# Patient Record
Sex: Female | Born: 1995 | Race: Black or African American | Hispanic: No | Marital: Single | Smoking: Current every day smoker
Health system: Southern US, Community
[De-identification: ages and names within clinical notes are randomized; demographics above are authoritative.]

## PROBLEM LIST (undated history)

## (undated) ENCOUNTER — Inpatient Hospital Stay: Payer: Self-pay

## (undated) DIAGNOSIS — F4310 Post-traumatic stress disorder, unspecified: Secondary | ICD-10-CM

## (undated) DIAGNOSIS — F32A Depression, unspecified: Secondary | ICD-10-CM

## (undated) HISTORY — PX: NO PAST SURGERIES: SHX2092

---
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
## 2015-12-09 ENCOUNTER — Emergency Department
Admission: EM | Admit: 2015-12-09 | Discharge: 2015-12-09 | Disposition: A | Discharge status: Home / Self Care | Payer: Medicaid - Out of State | Attending: Emergency Medicine | Admitting: Emergency Medicine

## 2015-12-09 ENCOUNTER — Encounter: Payer: Self-pay | Admitting: Emergency Medicine

## 2015-12-09 DIAGNOSIS — J029 Acute pharyngitis, unspecified: Secondary | ICD-10-CM | POA: Insufficient documentation

## 2015-12-09 DIAGNOSIS — F17200 Nicotine dependence, unspecified, uncomplicated: Secondary | ICD-10-CM | POA: Insufficient documentation

## 2015-12-09 LAB — POCT RAPID STREP A: Streptococcus, Group A Screen (Direct): NEGATIVE

## 2015-12-09 MED ORDER — DEXAMETHASONE SODIUM PHOSPHATE 4 MG/ML IJ SOLN
INTRAMUSCULAR | Status: AC
  Administered 2015-12-09: 10 mg via INTRAVENOUS
  Filled 2015-12-09: qty 3

## 2015-12-09 MED ORDER — KETOROLAC TROMETHAMINE 30 MG/ML IJ SOLN
30.0000 mg | Freq: Once | INTRAMUSCULAR | Status: AC
  Administered 2015-12-09: 30 mg via INTRAVENOUS
  Filled 2015-12-09: qty 1

## 2015-12-09 MED ORDER — DEXAMETHASONE SODIUM PHOSPHATE 4 MG/ML IJ SOLN
10.0000 mg | Freq: Once | INTRAMUSCULAR | Status: AC
  Administered 2015-12-09: 10 mg via INTRAVENOUS

## 2015-12-09 MED ORDER — IBUPROFEN 600 MG PO TABS: 600.0000 mg | ORAL_TABLET | Freq: Four times a day (QID) | ORAL | 0 refills | Status: DC | PRN

## 2015-12-09 MED ORDER — DEXAMETHASONE SODIUM PHOSPHATE 10 MG/ML IJ SOLN: 10.0000 mg | Freq: Once | INTRAMUSCULAR | Status: DC

## 2015-12-09 NOTE — ED Provider Notes (Signed)
[facility] Emergency Department Provider Note    ____________________________________________   I have reviewed the triage vital signs and the nursing notes.   HISTORY  Chief Complaint Sore Throat   History limited by: Not Limited   HPI Unice CobbleJavica S Kronk is a 20 y.o. female who presents to the emergency department today because of concerns for sore throat. The patient states she has been having pain in her throat for the past 3 days. She describes it as burning. Has gradually got worse and is become severe. She stated the symptoms are worse with swallowing. She feels like she cannot even swallow her spit at this point. She states she did have fevers when this first started. She denies any nausea or vomiting. Denies any known sick contacts. Denies any difficulty with breathing.   History reviewed. No pertinent past medical history.  There are no active problems to display for this patient.   History reviewed. No pertinent surgical history.  Prior to Admission medications   Not on File    Allergies Patient has no known allergies.  History reviewed. No pertinent family history.  Social History Social History  Substance Use Topics  . Smoking status: Current Every Day Smoker  . Smokeless tobacco: Never Used  . Alcohol use No    Review of Systems  Constitutional: Positive for fever. Cardiovascular: Negative for chest pain. Respiratory: Negative for shortness of breath. Gastrointestinal: Negative for abdominal pain, vomiting and diarrhea. Genitourinary: Negative for dysuria. Musculoskeletal: Negative for back pain. Skin: Negative for rash. Neurological: Negative for headaches, focal weakness or numbness.  10-point ROS otherwise negative.  ____________________________________________   PHYSICAL EXAM:  VITAL SIGNS: ED Triage Vitals [12/09/15 1653]  Enc Vitals Group     BP 127/78     Pulse Rate 76     Resp 18     Temp 98.7 F (37.1 C)      Temp Source Oral     SpO2 100 %     Weight 180 lb (81.6 kg)     Height 5\' 9"  (1.753 m)     Head Circumference      Peak Flow      Pain Score 9   Constitutional: Alert and oriented. Well appearing and in no distress. Eyes: Conjunctivae are normal. Normal extraocular movements. ENT   Head: Normocephalic and atraumatic.   Nose: No congestion/rhinnorhea.   Mouth/Throat: Mucous membranes are moist. Bilateral pharyngeal erythema and swelling. No uvula deviation.   Neck: No stridor. No hyoid tenderness.  Hematological/Lymphatic/Immunilogical: No cervical lymphadenopathy. Cardiovascular: Normal rate, regular rhythm.  No murmurs, rubs, or gallops.  Respiratory: Normal respiratory effort without tachypnea nor retractions. Breath sounds are clear and equal bilaterally. No wheezes/rales/rhonchi. Gastrointestinal: Soft and nontender. No distention.  Genitourinary: Deferred Musculoskeletal: Normal range of motion in all extremities. No lower extremity edema. Neurologic:  Normal speech and language. No gross focal neurologic deficits are appreciated.  Skin:  Skin is warm, dry and intact. No rash noted. Psychiatric: Mood and affect are normal. Speech and behavior are normal. Patient exhibits appropriate insight and judgment.  ____________________________________________    LABS (pertinent positives/negatives)  Labs Reviewed  POCT RAPID STREP A  POCT pregnancy negative   ____________________________________________   EKG  None  ____________________________________________    RADIOLOGY  None  ____________________________________________   PROCEDURES  Procedures  ____________________________________________   INITIAL IMPRESSION / ASSESSMENT AND PLAN / ED COURSE  Pertinent labs & imaging results that were available during my care of the patient were  reviewed by me and considered in my medical decision making (see chart for details).  Patient here with sore  throat. On exam bilateral symmetrical pharyngeal erythema and swelling. No uvula deviation. No hyoid tenderness. No other symptoms concerning for deep tissue infection. Will give Toradol and steroids and reassess for likely viral pharyngitis.   Clinical Course    Patient does feel better after medications. Was able to drink fluids. Stated she felt better. Will plan on giving patient high-dose ibuprofen. Discussed return precautions. ____________________________________________   FINAL CLINICAL IMPRESSION(S) / ED DIAGNOSES  Final diagnoses:  Pharyngitis, unspecified etiology     Note: This dictation was prepared with Dragon dictation. Any transcriptional errors that result from this process are unintentional    Phineas SemenGraydon Tanyika Barros, MD 12/09/15 1926

## 2015-12-09 NOTE — Discharge Instructions (Signed)
Please seek medical attention for any high fevers, chest pain, shortness of breath, change in behavior, persistent vomiting, bloody stool or any other new or concerning symptoms.  

## 2015-12-09 NOTE — ED Notes (Signed)
Pt states sore throat x 3 days, denies fever, N&V, diarrhea. Pt states painful to talk, painful to swallow. Pt voice sounds hoarse. Pt denies being around anyone sick recently.

## 2015-12-09 NOTE — ED Notes (Signed)
Pt given urine cup to give sample.

## 2015-12-09 NOTE — ED Notes (Signed)
Both POC Preg and POC Strep test negative.

## 2015-12-09 NOTE — ED Triage Notes (Addendum)
Sore throat started 3 days ago. Denies fevers. Pt reports cannot swallow spit.denies trouble breathing except in AM. Tonsils touching uvula, left appears greater than right. Not drooling.

## 2015-12-11 ENCOUNTER — Emergency Department (HOSPITAL_COMMUNITY)
Admission: EM | Admit: 2015-12-11 | Discharge: 2015-12-12 | Disposition: A | Discharge status: Home / Self Care | Payer: Medicaid - Out of State | Attending: Emergency Medicine | Admitting: Emergency Medicine

## 2015-12-11 ENCOUNTER — Encounter (HOSPITAL_COMMUNITY): Payer: Self-pay | Admitting: Emergency Medicine

## 2015-12-11 DIAGNOSIS — Z79899 Other long term (current) drug therapy: Secondary | ICD-10-CM | POA: Diagnosis not present

## 2015-12-11 DIAGNOSIS — J36 Peritonsillar abscess: Secondary | ICD-10-CM

## 2015-12-11 DIAGNOSIS — F17200 Nicotine dependence, unspecified, uncomplicated: Secondary | ICD-10-CM | POA: Diagnosis not present

## 2015-12-11 NOTE — ED Notes (Signed)
Pt reports change in condition that she returned to the ER is due to swollen lymph nodes.

## 2015-12-11 NOTE — ED Triage Notes (Signed)
Per EMS pt c/o difficulty swallowing. Lung sounds clear in all fields, 100% RA. No fever noted during transport. Pt reports being seen at [facility] Wednesday and getting prescriptions filled today. [facility]. Pt took 1800mg  Ibprofun @2200 .

## 2015-12-12 LAB — RAPID STREP SCREEN (MED CTR MEBANE ONLY): STREPTOCOCCUS, GROUP A SCREEN (DIRECT): NEGATIVE

## 2015-12-12 MED ORDER — BENZOCAINE 20 % MT AERO
INHALATION_SPRAY | Freq: Once | OROMUCOSAL | Status: AC
  Administered 2015-12-12: 01:00:00 via OROMUCOSAL

## 2015-12-12 MED ORDER — AMOXICILLIN-POT CLAVULANATE 400-57 MG/5ML PO SUSR: 875.0000 mg | Freq: Two times a day (BID) | ORAL | 0 refills | Status: AC

## 2015-12-12 MED ORDER — AMOXICILLIN-POT CLAVULANATE 400-57 MG/5ML PO SUSR
880.0000 mg | Freq: Once | ORAL | Status: AC
  Administered 2015-12-12: 880 mg via ORAL
  Filled 2015-12-12: qty 11

## 2015-12-12 NOTE — ED Provider Notes (Signed)
[facility]-EMERGENCY DEPT Provider Note   CSN: [id number] Arrival date & time: 12/11/15  2129     History   Chief Complaint No chief complaint on file.   HPI Maureen Cox is a 20 y.o. female.  HPI 20 year old female presents to the ED with worsening left peritonsillar swelling and pain. Patient was seen at [facility] and diagnosed with pharyngitis. Rapid strep that was negative. Patient reports that since then his symptoms worsen. Lateralizing to the left side.. Patient has had difficulty swallowing. Still able to swallow liquids. Attempted taken Motrin for the pain with minimal relief. Denies fevers, chills, nausea, vomiting, shortness of breath, difficulty breathing.  History reviewed. No pertinent past medical history.  There are no active problems to display for this patient.   History reviewed. No pertinent surgical history.  OB History    No data available       Home Medications    Prior to Admission medications   Medication Sig Start Date End Date Taking? Authorizing Provider  amoxicillin-clavulanate (AUGMENTIN) 400-57 MG/5ML suspension Take 10.9 mLs (875 mg total) by mouth 2 (two) times daily. 12/12/15 12/22/15  Nira ConnPedro Eduardo Cardama, MD  ibuprofen (ADVIL,MOTRIN) 600 MG tablet Take 1 tablet (600 mg total) by mouth every 6 (six) hours as needed. 12/09/15   Phineas SemenGraydon Goodman, MD    Family History No family history on file.  Social History Social History  Substance Use Topics  . Smoking status: Current Every Day Smoker  . Smokeless tobacco: Never Used  . Alcohol use No     Allergies   Patient has no known allergies.   Review of Systems Review of Systems Ten systems are reviewed and are negative for acute change except as noted in the HPI   Physical Exam Updated Vital Signs BP 121/78 (BP Location: Right Arm)   Pulse 76   Temp 98 F (36.7 C) (Oral)   Resp 15   LMP 11/19/2015   SpO2 99%   Physical Exam  Constitutional: She is oriented to person, place,  and time. She appears well-developed and well-nourished. No distress.  HENT:  Head: Normocephalic and atraumatic.  Right Ear: External ear normal.  Left Ear: External ear normal.  Nose: Nose normal.  Mouth/Throat: Posterior oropharyngeal erythema and tonsillar abscesses (left) present. Tonsils are 3+ on the left. No tonsillar exudate.  Eyes: Conjunctivae and EOM are normal. No scleral icterus.  Neck: Normal range of motion and phonation normal.  Cardiovascular: Normal rate and regular rhythm.   Pulmonary/Chest: Effort normal. No stridor. No respiratory distress.  Abdominal: She exhibits no distension.  Musculoskeletal: Normal range of motion. She exhibits no edema.  Neurological: She is alert and oriented to person, place, and time.  Skin: She is not diaphoretic.  Psychiatric: She has a normal mood and affect. Her behavior is normal.  Vitals reviewed.    ED Treatments / Results  Labs (all labs ordered are listed, but only abnormal results are displayed) Labs Reviewed  RAPID STREP SCREEN (NOT AT [facility])  CULTURE, GROUP A STREP [facility])    EKG  EKG Interpretation None       Radiology No results found.  Procedures .Marland Kitchen.Incision and Drainage Date/Time: 12/12/2015 1:21 AM Performed by: Nira ConnARDAMA, PEDRO EDUARDO Authorized by: Nira ConnARDAMA, PEDRO EDUARDO   Consent:    Consent obtained:  Verbal   Consent given by:  Patient   Risks discussed:  Bleeding and pain   Alternatives discussed:  Alternative treatment Location:    Type:  Abscess   Location: left  peritonsilar. Anesthesia (see MAR for exact dosages):    Anesthesia method:  Topical application   Topical anesthesia: hurricaine spray. Procedure type:    Complexity:  Simple Procedure details:    Needle aspiration: yes     Needle size:  18 G   Drainage:  Bloody and purulent   Drainage amount:  Scant Post-procedure details:    Patient tolerance of procedure:  Tolerated well, no immediate complications   (including critical  care time) Emergency Focused Ultrasound Exam Limited Ultrasound of Soft Tissue   Performed and interpreted by Dr. Eudelia Bunchardama Indication: evaluation for infection or foreign body Transverse and Sagittal views of left tonsil are obtained in real time for the purposes of evaluation of skin and underlying soft tissues.  Findings: + heterogeneous fluid collection, no hyperemia/edema of surrounding tissue Interpretation: left PTA  Images archived electronically.  CPT Codes:   Other soft tissue [id number]    Medications Ordered in ED Medications  Benzocaine (HURRCAINE) 20 % mouth spray ( Mouth/Throat Given 12/12/15 0114)  amoxicillin-clavulanate (AUGMENTIN) 400-57 MG/5ML suspension 880 mg (880 mg Oral Given 12/12/15 0129)     Initial Impression / Assessment and Plan / ED Course  I have reviewed the triage vital signs and the nursing notes.  Pertinent labs & imaging results that were available during my care of the patient were reviewed by me and considered in my medical decision making (see chart for details).  Clinical Course     Left peritonsillar abscess confirmed by ultrasound. Needle aspiration was performed obtaining a small amount of bloody/purulent fluid. Improved symptomatology. Rapid strep negative. Culture sent. Provided patient with a dose of Augmentin suspension.  Able to tolerate by mouth.  Safe for discharge with strict return precautions.  Final Clinical Impressions(s) / ED Diagnoses   Final diagnoses:  Peritonsillar abscess   Disposition: Discharge  Condition: Good  I have discussed the results, Dx and Tx plan with the patient who expressed understanding and agree(s) with the plan. Discharge instructions discussed at great length. The patient was given strict return precautions who verbalized understanding of the instructions. No further questions at time of discharge.    New Prescriptions   AMOXICILLIN-CLAVULANATE (AUGMENTIN) 400-57 MG/5ML SUSPENSION    Take  10.9 mLs (875 mg total) by mouth 2 (two) times daily.    Follow Up: [facility] [address] [place] [id number] [phone] Call  For help establishing care with a care provider  [facility] [address] [id number] [place] [id number] [phone] Go to  in 3-5 days, If symptoms do not improve or  worsen      Nira ConnPedro Eduardo Cardama, MD 12/12/15 [phone]

## 2015-12-14 LAB — CULTURE, GROUP A STREP (THRC): SPECIAL REQUESTS: NORMAL

## 2015-12-14 LAB — POCT PREGNANCY, URINE: Preg Test, Ur: NEGATIVE

## 2016-11-26 ENCOUNTER — Encounter: Payer: Self-pay | Admitting: Emergency Medicine

## 2016-11-26 ENCOUNTER — Emergency Department
Admission: EM | Admit: 2016-11-26 | Discharge: 2016-11-26 | Disposition: A | Discharge status: Home / Self Care | Payer: Medicaid - Out of State | Attending: Emergency Medicine | Admitting: Emergency Medicine

## 2016-11-26 ENCOUNTER — Other Ambulatory Visit: Payer: Self-pay

## 2016-11-26 DIAGNOSIS — J029 Acute pharyngitis, unspecified: Secondary | ICD-10-CM | POA: Insufficient documentation

## 2016-11-26 DIAGNOSIS — F17210 Nicotine dependence, cigarettes, uncomplicated: Secondary | ICD-10-CM | POA: Diagnosis not present

## 2016-11-26 MED ORDER — IBUPROFEN 600 MG PO TABS: 600.0000 mg | ORAL_TABLET | Freq: Four times a day (QID) | ORAL | 0 refills | Status: DC | PRN

## 2016-11-26 MED ORDER — AMOXICILLIN 500 MG PO TABS: 500.0000 mg | ORAL_TABLET | Freq: Three times a day (TID) | ORAL | 0 refills | Status: DC

## 2016-11-26 NOTE — ED Notes (Signed)
Pt to ed with c/o sore throat x 4 days,  Pt states fever x 2 days, none today.  Has not take OTC meds for fever in last 24 hours.  Pt throat red and swollen with muffled speech.  Pt also reports earache bilaterally x 2 days.

## 2016-11-26 NOTE — Discharge Instructions (Signed)
Follow up with the primary care provider of your choice for symptoms that are not improving over the next 48 hours.  Return to the ER for symptoms that change or worsen if unable to schedule an appointment.

## 2016-11-26 NOTE — ED Triage Notes (Signed)
Sore throat x 4 days 

## 2016-11-26 NOTE — ED Provider Notes (Signed)
[facility] Emergency Department Provider Note  ____________________________________________  Time seen: Approximately 8:20 AM  I have reviewed the triage vital signs and the nursing notes.   HISTORY  Chief Complaint Sore Throat    HPI Maureen Cox is a 21 y.o. female who presents to the emergency department for evaluation and treatment of sore throat for the past 4 days. She also states that she's had a fever for 2 days. She has not taken any medications for her sore throat or fever. She also states that she's developed some pain in both ears over the past couple of days. She reports no chronic medical history, no known drug allergies, and no current daily medications.  History reviewed. No pertinent past medical history.  There are no active problems to display for this patient.   History reviewed. No pertinent surgical history.  Prior to Admission medications   Medication Sig Start Date End Date Taking? Authorizing Provider  amoxicillin (AMOXIL) 500 MG tablet Take 1 tablet (500 mg total) 3 (three) times daily by mouth. 11/26/16   Aleshia Cartelli B, FNP  ibuprofen (ADVIL,MOTRIN) 600 MG tablet Take 1 tablet (600 mg total) every 6 (six) hours as needed by mouth. 11/26/16   Amirrah Quigley B, FNP    Allergies Patient has no known allergies.  No family history on file.  Social History Social History   Tobacco Use  . Smoking status: Current Every Day Smoker    Packs/day: 0.50    Types: Cigarettes  . Smokeless tobacco: Never Used  Substance Use Topics  . Alcohol use: No  . Drug use: No    Review of Systems Constitutional: Positive for fever. Eyes: No visual changes. ENT: Positive for sore throat; negative for difficulty swallowing. Respiratory: Denies shortness of breath. Gastrointestinal: No abdominal pain.  No nausea, no vomiting.  No diarrhea.  Genitourinary: Negative for dysuria. Musculoskeletal: Negative for generalized body  aches. Skin: Negative for rash. Neurological: Negative for headaches, negative  focal weakness or numbness.  ____________________________________________   PHYSICAL EXAM:  VITAL SIGNS: ED Triage Vitals  Enc Vitals Group     BP 11/26/16 0729 122/87     Pulse Rate 11/26/16 0729 96     Resp 11/26/16 0729 20     Temp 11/26/16 0729 98.6 F (37 C)     Temp Source 11/26/16 0729 Oral     SpO2 11/26/16 0729 96 %     Weight 11/26/16 0730 182 lb (82.6 kg)     Height 11/26/16 0730 5\' 9"  (1.753 m)     Head Circumference --      Peak Flow --      Pain Score 11/26/16 0729 10     Pain Loc --      Pain Edu? --      Excl. in GC? --    Constitutional: Alert and oriented. Well appearing and in no acute distress. Eyes: Conjunctivae are normal.  Head: Atraumatic. Nose: No congestion/rhinnorhea. Ears: Bilateral tympanic membranes are normal Mouth/Throat: Mucous membranes are moist.  Oropharynx erythematous, tonsils 2+ with exudate. Uvula is midline. Neck: No stridor.  Lymphatic: Lymphadenopathy: Bilateral anterior cervical lymphadenopathy palpable on exam. Cardiovascular: Normal rate, regular rhythm. Good peripheral circulation. Respiratory: Normal respiratory effort. Lungs CTAB. Gastrointestinal: Soft and nontender. Musculoskeletal: No lower extremity tenderness nor edema.  Neurologic:  Normal speech and language. No gross focal neurologic deficits are appreciated. Speech is normal. No gait instability. Skin:  Skin is warm, dry and intact. No rash noted Psychiatric:  Mood and affect are normal. Speech and behavior are normal.  ____________________________________________   LABS (all labs ordered are listed, but only abnormal results are displayed)  Labs Reviewed - No data to display ____________________________________________  EKG  Not indicated ____________________________________________  RADIOLOGY  Not  indicated ____________________________________________   PROCEDURES  Procedure(s) performed: None  Critical Care performed: No ____________________________________________   INITIAL IMPRESSION / ASSESSMENT AND PLAN / ED COURSE  21 year old female presenting to the emergency department for treatment and evaluation of sore throat, fever, and ear pain for the past 4 days. Exudate is noted on tonsils, therefore she will be treated with amoxicillin and will be given ibuprofen for pain and fever. She was instructed to follow-up with the primary care provider for choice for symptoms that are not improving over the next couple of days. She was instructed to return to the emergency department for symptoms that change or worsen if she is unable schedule an appointment.  Pertinent labs & imaging results that were available during my care of the patient were reviewed by me and considered in my medical decision making (see chart for details). ____________________________________________   FINAL CLINICAL IMPRESSION(S) / ED DIAGNOSES  Final diagnoses:  Exudative pharyngitis    If controlled substance prescribed during this visit, 12 month history viewed on the [facility] prior to issuing an initial prescription for Schedule II or III opiod.   Note:  This document was prepared using Dragon voice recognition software and may include unintentional dictation errors.    Chinita Pesterriplett, Jomayra Novitsky B, FNP 11/26/16 1100    Arnaldo NatalMalinda, Paul F, MD 11/26/16 [phone]

## 2017-04-11 ENCOUNTER — Emergency Department
Admission: EM | Admit: 2017-04-11 | Discharge: 2017-04-11 | Disposition: A | Discharge status: Home / Self Care | Payer: Medicaid - Out of State | Attending: Emergency Medicine | Admitting: Emergency Medicine

## 2017-04-11 ENCOUNTER — Encounter: Payer: Self-pay | Admitting: Emergency Medicine

## 2017-04-11 ENCOUNTER — Other Ambulatory Visit: Payer: Self-pay

## 2017-04-11 DIAGNOSIS — F17210 Nicotine dependence, cigarettes, uncomplicated: Secondary | ICD-10-CM | POA: Insufficient documentation

## 2017-04-11 DIAGNOSIS — N3001 Acute cystitis with hematuria: Secondary | ICD-10-CM

## 2017-04-11 DIAGNOSIS — B9689 Other specified bacterial agents as the cause of diseases classified elsewhere: Secondary | ICD-10-CM

## 2017-04-11 DIAGNOSIS — N760 Acute vaginitis: Secondary | ICD-10-CM | POA: Insufficient documentation

## 2017-04-11 LAB — URINALYSIS, COMPLETE (UACMP) WITH MICROSCOPIC
Bilirubin Urine: NEGATIVE
GLUCOSE, UA: NEGATIVE mg/dL
KETONES UR: NEGATIVE mg/dL
NITRITE: NEGATIVE
PROTEIN: NEGATIVE mg/dL
Specific Gravity, Urine: 1.015 (ref 1.005–1.030)
pH: 6 (ref 5.0–8.0)

## 2017-04-11 LAB — WET PREP, GENITAL
Sperm: NONE SEEN
Trich, Wet Prep: NONE SEEN
YEAST WET PREP: NONE SEEN

## 2017-04-11 LAB — CHLAMYDIA/NGC RT PCR (ARMC ONLY)
CHLAMYDIA TR: NOT DETECTED
N GONORRHOEAE: NOT DETECTED

## 2017-04-11 LAB — POC URINE PREG, ED: Preg Test, Ur: NEGATIVE

## 2017-04-11 MED ORDER — METRONIDAZOLE 500 MG PO TABS: 500.0000 mg | ORAL_TABLET | Freq: Two times a day (BID) | ORAL | 0 refills | Status: AC

## 2017-04-11 MED ORDER — CEPHALEXIN 500 MG PO CAPS: 500.0000 mg | ORAL_CAPSULE | Freq: Two times a day (BID) | ORAL | 0 refills | Status: AC

## 2017-04-11 NOTE — ED Triage Notes (Addendum)
Patient ambulatory to triage with steady gait, without difficulty or distress noted; pt reports here for vag itching & irritation x 2 days; denies vag discharge

## 2017-04-11 NOTE — ED Provider Notes (Signed)
[facility] Emergency Department Provider Note  ____________________________________________  Time seen: Approximately 8:02 PM  I have reviewed the triage vital signs and the nursing notes.   HISTORY  Chief Complaint Vaginal Itching    HPI Maureen Cox is a 22 y.o. female that presents to the emergency department for evaluation of vaginal itching and dysuria for 2 days.  Low back was hurting yesterday.  Patient denies nausea, vomiting, abdominal pain, vaginal discharge, hematuria.  History reviewed. No pertinent past medical history.  There are no active problems to display for this patient.   History reviewed. No pertinent surgical history.  Prior to Admission medications   Medication Sig Start Date End Date Taking? Authorizing Provider  amoxicillin (AMOXIL) 500 MG tablet Take 1 tablet (500 mg total) 3 (three) times daily by mouth. 11/26/16   Triplett, Cari B, FNP  cephALEXin (KEFLEX) 500 MG capsule Take 1 capsule (500 mg total) by mouth 2 (two) times daily for 10 days. 04/11/17 04/21/17  Enid DerryWagner, Salene Mohamud, PA-C  ibuprofen (ADVIL,MOTRIN) 600 MG tablet Take 1 tablet (600 mg total) every 6 (six) hours as needed by mouth. 11/26/16   Triplett, Cari B, FNP  metroNIDAZOLE (FLAGYL) 500 MG tablet Take 1 tablet (500 mg total) by mouth 2 (two) times daily for 7 days. 04/11/17 04/18/17  Enid DerryWagner, Kimiyah Blick, PA-C    Allergies Patient has no known allergies.  No family history on file.  Social History Social History   Tobacco Use  . Smoking status: Current Every Day Smoker    Packs/day: 0.50    Types: Cigarettes  . Smokeless tobacco: Never Used  Substance Use Topics  . Alcohol use: No  . Drug use: No     Review of Systems  Constitutional: No fever/chills Cardiovascular: No chest pain. Respiratory: No SOB. Gastrointestinal: No abdominal pain.  No nausea, no vomiting.  Genitourinary: Positive for dysuria. Musculoskeletal: Positive for low back pain. Skin:  Negative for rash, abrasions, lacerations, ecchymosis.   ____________________________________________   PHYSICAL EXAM:  VITAL SIGNS: ED Triage Vitals  Enc Vitals Group     BP 04/11/17 1903 (!) 129/54     Pulse Rate 04/11/17 1903 78     Resp 04/11/17 1903 20     Temp 04/11/17 1903 98.4 F (36.9 C)     Temp Source 04/11/17 1903 Oral     SpO2 04/11/17 1903 98 %     Weight 04/11/17 1902 225 lb (102.1 kg)     Height 04/11/17 1902 5\' 9"  (1.753 m)     Head Circumference --      Peak Flow --      Pain Score 04/11/17 1902 0     Pain Loc --      Pain Edu? --      Excl. in GC? --      Constitutional: Alert and oriented. Well appearing and in no acute distress. Eyes: Conjunctivae are normal. PERRL. EOMI. Head: Atraumatic. ENT:      Ears:      Nose: No congestion/rhinnorhea.      Mouth/Throat: Mucous membranes are moist.  Neck: No stridor. Cardiovascular: Normal rate, regular rhythm.  Good peripheral circulation. Respiratory: Normal respiratory effort without tachypnea or retractions. Lungs CTAB. Good air entry to the bases with no decreased or absent breath sounds. Gastrointestinal: Bowel sounds 4 quadrants. Soft and nontender to palpation. No guarding or rigidity. No palpable masses. No distention. No CVA tenderness.  Musculoskeletal: Full range of motion to all extremities. No gross deformities appreciated.  Genitourinary: RN present for pelvic exam.  No external rashes or lesions seen.  White vaginal discharge.  No cervical motion tenderness. Neurologic:  Normal speech and language. No gross focal neurologic deficits are appreciated.  Skin:  Skin is warm, dry and intact. No rash noted.   ____________________________________________   LABS (all labs ordered are listed, but only abnormal results are displayed)  Labs Reviewed  WET PREP, GENITAL - Abnormal; Notable for the following components:      Result Value   Clue Cells Wet Prep HPF POC PRESENT (*)    WBC, Wet Prep HPF  POC FEW (*)    All other components within normal limits  URINALYSIS, COMPLETE (UACMP) WITH MICROSCOPIC - Abnormal; Notable for the following components:   Color, Urine YELLOW (*)    APPearance CLOUDY (*)    Hgb urine dipstick MODERATE (*)    Leukocytes, UA LARGE (*)    Bacteria, UA RARE (*)    Squamous Epithelial / LPF 6-30 (*)    All other components within normal limits  CHLAMYDIA/NGC RT PCR ([facility] ONLY)  POC URINE PREG, ED   ____________________________________________  EKG   ____________________________________________  RADIOLOGY   No results found.  ____________________________________________    PROCEDURES  Procedure(s) performed:    Procedures    Medications - No data to display   ____________________________________________   INITIAL IMPRESSION / ASSESSMENT AND PLAN / ED COURSE  Pertinent labs & imaging results that were available during my care of the patient were reviewed by me and considered in my medical decision making (see chart for details).  Review of the [place] CSRS was performed in accordance of the [facility] prior to dispensing any controlled drugs.   Patient's diagnosis is consistent with UTI and bacterial vaginosis.  Vital signs and exam are reassuring.  Wet prep consistent with bacterial vaginosis.  Urinalysis consistent with infection.  Patient will be discharged home with prescriptions for Keflex and Flagyl. Patient is to follow up with PCP as directed. Patient is given ED precautions to return to the ED for any worsening or new symptoms.     ____________________________________________  FINAL CLINICAL IMPRESSION(S) / ED DIAGNOSES  Final diagnoses:  Acute cystitis with hematuria  Bacterial vaginosis      NEW MEDICATIONS STARTED DURING THIS VISIT:  ED Discharge Orders        Ordered    metroNIDAZOLE (FLAGYL) 500 MG tablet  2 times daily     04/11/17 2118    cephALEXin (KEFLEX) 500 MG capsule  2 times daily     04/11/17  2118          This chart was dictated using voice recognition software/Dragon. Despite best efforts to proofread, errors can occur which can change the meaning. Any change was purely unintentional.    Enid Derry, PA-C 04/11/17 2218    Loleta Rose, MD 04/11/17 2224

## 2017-11-07 ENCOUNTER — Other Ambulatory Visit: Payer: Self-pay

## 2017-11-07 ENCOUNTER — Emergency Department
Admission: EM | Admit: 2017-11-07 | Discharge: 2017-11-07 | Disposition: A | Discharge status: Home / Self Care | Payer: Self-pay | Attending: Emergency Medicine | Admitting: Emergency Medicine

## 2017-11-07 ENCOUNTER — Emergency Department: Payer: Self-pay

## 2017-11-07 ENCOUNTER — Encounter: Payer: Self-pay | Admitting: Emergency Medicine

## 2017-11-07 DIAGNOSIS — R1030 Lower abdominal pain, unspecified: Secondary | ICD-10-CM | POA: Insufficient documentation

## 2017-11-07 DIAGNOSIS — K5900 Constipation, unspecified: Secondary | ICD-10-CM | POA: Insufficient documentation

## 2017-11-07 DIAGNOSIS — R102 Pelvic and perineal pain: Secondary | ICD-10-CM

## 2017-11-07 DIAGNOSIS — F17210 Nicotine dependence, cigarettes, uncomplicated: Secondary | ICD-10-CM | POA: Insufficient documentation

## 2017-11-07 DIAGNOSIS — R112 Nausea with vomiting, unspecified: Secondary | ICD-10-CM | POA: Insufficient documentation

## 2017-11-07 LAB — URINALYSIS, COMPLETE (UACMP) WITH MICROSCOPIC
Bacteria, UA: NONE SEEN
Bilirubin Urine: NEGATIVE
Glucose, UA: NEGATIVE mg/dL
KETONES UR: 5 mg/dL — AB
Leukocytes, UA: NEGATIVE
Nitrite: NEGATIVE
PH: 7 (ref 5.0–8.0)
Protein, ur: NEGATIVE mg/dL
Specific Gravity, Urine: 1.011 (ref 1.005–1.030)

## 2017-11-07 LAB — COMPREHENSIVE METABOLIC PANEL
ALT: 30 U/L (ref 0–44)
ANION GAP: 9 (ref 5–15)
AST: 21 U/L (ref 15–41)
Albumin: 4.1 g/dL (ref 3.5–5.0)
Alkaline Phosphatase: 74 U/L (ref 38–126)
BILIRUBIN TOTAL: 0.4 mg/dL (ref 0.3–1.2)
BUN: 9 mg/dL (ref 6–20)
CALCIUM: 9.4 mg/dL (ref 8.9–10.3)
CO2: 27 mmol/L (ref 22–32)
Chloride: 104 mmol/L (ref 98–111)
Creatinine, Ser: 0.88 mg/dL (ref 0.44–1.00)
Glucose, Bld: 115 mg/dL — ABNORMAL HIGH (ref 70–99)
POTASSIUM: 3.8 mmol/L (ref 3.5–5.1)
Sodium: 140 mmol/L (ref 135–145)
TOTAL PROTEIN: 8 g/dL (ref 6.5–8.1)

## 2017-11-07 LAB — LIPASE, BLOOD: Lipase: 35 U/L (ref 11–51)

## 2017-11-07 LAB — CBC
HCT: 43.7 % (ref 36.0–46.0)
Hemoglobin: 14.6 g/dL (ref 12.0–15.0)
MCH: 28.7 pg (ref 26.0–34.0)
MCHC: 33.4 g/dL (ref 30.0–36.0)
MCV: 86 fL (ref 80.0–100.0)
PLATELETS: 452 10*3/uL — AB (ref 150–400)
RBC: 5.08 MIL/uL (ref 3.87–5.11)
RDW: 12.5 % (ref 11.5–15.5)
WBC: 6.1 10*3/uL (ref 4.0–10.5)
nRBC: 0 % (ref 0.0–0.2)

## 2017-11-07 LAB — PREGNANCY, URINE: Preg Test, Ur: NEGATIVE

## 2017-11-07 LAB — POCT PREGNANCY, URINE: Preg Test, Ur: NEGATIVE

## 2017-11-07 MED ORDER — ONDANSETRON 4 MG PO TBDP
4.0000 mg | ORAL_TABLET | Freq: Once | ORAL | Status: AC
  Administered 2017-11-07: 4 mg via ORAL
  Filled 2017-11-07: qty 1

## 2017-11-07 MED ORDER — ONDANSETRON 4 MG PO TBDP: 4.0000 mg | ORAL_TABLET | Freq: Three times a day (TID) | ORAL | 0 refills | Status: DC | PRN

## 2017-11-07 MED ORDER — IBUPROFEN 400 MG PO TABS: 600.0000 mg | ORAL_TABLET | Freq: Once | ORAL | Status: DC

## 2017-11-07 MED ORDER — IBUPROFEN 400 MG PO TABS
400.0000 mg | ORAL_TABLET | Freq: Once | ORAL | Status: AC
  Administered 2017-11-07: 400 mg via ORAL
  Filled 2017-11-07: qty 1

## 2017-11-07 NOTE — ED Provider Notes (Signed)
[facility] Emergency Department Provider Note  ____________________________________________  Time seen: Approximately 2:53 PM  I have reviewed the triage vital signs and the nursing notes.   HISTORY  Chief Complaint Emesis   HPI Maureen Cox is a 22 y.o. female no significant past medical history who presents for evaluation of nausea, vomiting, and abdominal pain.  Patient reports 2 days of lower abdominal pain that she describes as intermittent, sharp, located suprapubically, nonradiating.  No pain at this time.  She has had persistent nausea and 4 episodes of nonbloody nonbilious emesis over the last 2 days.  She is still able to eat and drink.  She denies fever, chills, dysuria, hematuria, diarrhea, chest pain, shortness of breath, vaginal discharge.  She is currently on her menstrual period.  She denies any prior STDs.  She is sexually active. No prior abdominal surgery.  Patient thinks that her last bowel movement was a week ago.  She reports that that is normal for her to be constipated.   PMH None - reviewed   Prior to Admission medications   Medication Sig Start Date End Date Taking? Authorizing Provider  amoxicillin (AMOXIL) 500 MG tablet Take 1 tablet (500 mg total) 3 (three) times daily by mouth. 11/26/16   Triplett, Cari B, FNP  ibuprofen (ADVIL,MOTRIN) 600 MG tablet Take 1 tablet (600 mg total) every 6 (six) hours as needed by mouth. 11/26/16   Triplett, Cari B, FNP  ondansetron (ZOFRAN ODT) 4 MG disintegrating tablet Take 1 tablet (4 mg total) by mouth every 8 (eight) hours as needed for nausea or vomiting. 11/07/17   Nita Sickle, MD    Allergies Patient has no known allergies.  No family history on file.  Social History Social History   Tobacco Use  . Smoking status: Current Every Day Smoker    Packs/day: 0.50    Types: Cigarettes  . Smokeless tobacco: Never Used  Substance Use Topics  . Alcohol use: No  . Drug use: No     Review of Systems  Constitutional: Negative for fever. Eyes: Negative for visual changes. ENT: Negative for sore throat. Neck: No neck pain  Cardiovascular: Negative for chest pain. Respiratory: Negative for shortness of breath. Gastrointestinal: + suprapubic abdominal pain, nausea, and vomiting. No diarrhea. Genitourinary: Negative for dysuria. Musculoskeletal: Negative for back pain. Skin: Negative for rash. Neurological: Negative for headaches, weakness or numbness. Psych: No SI or HI  ____________________________________________   PHYSICAL EXAM:  VITAL SIGNS: ED Triage Vitals  Enc Vitals Group     BP 11/07/17 1240 (!) 117/94     Pulse Rate 11/07/17 1240 83     Resp 11/07/17 1240 16     Temp 11/07/17 1240 98.7 F (37.1 C)     Temp Source 11/07/17 1240 Oral     SpO2 11/07/17 1240 99 %     Weight 11/07/17 1241 225 lb (102.1 kg)     Height 11/07/17 1241 5\' 10"  (1.778 m)     Head Circumference --      Peak Flow --      Pain Score 11/07/17 1240 8     Pain Loc --      Pain Edu? --      Excl. in GC? --     Constitutional: Alert and oriented. Well appearing and in no apparent distress. HEENT:      Head: Normocephalic and atraumatic.         Eyes: Conjunctivae are normal. Sclera is non-icteric.  Mouth/Throat: Mucous membranes are moist.       Neck: Supple with no signs of meningismus. Cardiovascular: Regular rate and rhythm. No murmurs, gallops, or rubs. 2+ symmetrical distal pulses are present in all extremities. No JVD. Respiratory: Normal respiratory effort. Lungs are clear to auscultation bilaterally. No wheezes, crackles, or rhonchi.  Gastrointestinal: Soft, non tender, and non distended with positive bowel sounds. No rebound or guarding. Genitourinary: No CVA tenderness. Musculoskeletal: Nontender with normal range of motion in all extremities. No edema, cyanosis, or erythema of extremities. Neurologic: Normal speech and language. Face is symmetric.  Moving all extremities. No gross focal neurologic deficits are appreciated. Skin: Skin is warm, dry and intact. No rash noted. Psychiatric: Mood and affect are normal. Speech and behavior are normal.  ____________________________________________   LABS (all labs ordered are listed, but only abnormal results are displayed)  Labs Reviewed  COMPREHENSIVE METABOLIC PANEL - Abnormal; Notable for the following components:      Result Value   Glucose, Bld 115 (*)    All other components within normal limits  CBC - Abnormal; Notable for the following components:   Platelets 452 (*)    All other components within normal limits  URINALYSIS, COMPLETE (UACMP) WITH MICROSCOPIC - Abnormal; Notable for the following components:   Color, Urine YELLOW (*)    APPearance CLEAR (*)    Hgb urine dipstick LARGE (*)    Ketones, ur 5 (*)    All other components within normal limits  LIPASE, BLOOD  PREGNANCY, URINE  POC URINE PREG, ED  POCT PREGNANCY, URINE   ____________________________________________  EKG  none  ____________________________________________  RADIOLOGY  I have personally reviewed the images performed during this visit and I agree with the Radiologist's read.   Interpretation by Radiologist:  Dg Abdomen 1 View  Result Date: 11/07/2017 CLINICAL DATA:  22 y/o  F; 2 days of vomiting. EXAM: ABDOMEN - 1 VIEW COMPARISON:  None. FINDINGS: The bowel gas pattern is normal. No radio-opaque calculi or other significant radiographic abnormality are seen. IMPRESSION: Negative. Electronically Signed   By: Mitzi Hansen M.D.   On: 11/07/2017 14:47     ____________________________________________   PROCEDURES  Procedure(s) performed: None Procedures Critical Care performed:  None ____________________________________________   INITIAL IMPRESSION / ASSESSMENT AND PLAN / ED COURSE  22 y.o. female no significant past medical history who presents for evaluation of nausea,  vomiting, and abdominal pain.  Patient is extremely well-appearing, no distress, looks well-hydrated, normal vital signs, abdomen is soft with no tenderness throughout.  Pregnancy test negative.  Labs including CBC, CMP and lipase with no acute findings.  UA negative for UTI.  Recommended STD testing but patient refused.  At this time there is no tenderness or abdominal pain therefore low clinical suspicion for any acute intra-abdominal pathology such as appendicitis, ovarian pathology, diverticulitis, or SBO.  Patient be discharged home with Zofran and MiraLAX for constipation.  Recommend close follow-up with primary care doctor.  Discussed my standard return precautions.      As part of my medical decision making, I reviewed the following data within the electronic MEDICAL RECORD NUMBER Nursing notes reviewed and incorporated, Labs reviewed , Old chart reviewed, Radiograph reviewed , Notes from prior ED visits and [place] Controlled Substance Database    Pertinent labs & imaging results that were available during my care of the patient were reviewed by me and considered in my medical decision making (see chart for details).    ____________________________________________   FINAL  CLINICAL IMPRESSION(S) / ED DIAGNOSES  Final diagnoses:  Non-intractable vomiting with nausea, unspecified vomiting type  Constipation, unspecified constipation type  Suprapubic abdominal pain      NEW MEDICATIONS STARTED DURING THIS VISIT:  ED Discharge Orders         Ordered    ondansetron (ZOFRAN ODT) 4 MG disintegrating tablet  Every 8 hours PRN     11/07/17 1500           Note:  This document was prepared using Dragon voice recognition software and may include unintentional dictation errors.    Don Perking, [place], MD 11/07/17 [phone]

## 2017-11-07 NOTE — ED Triage Notes (Signed)
Pt in via POV with complaints of vomiting x 2 days, reports unable to keep anything down, reports some lower abdominal pain., denies any diarrhea.  Vitals WDL, NAD noted at this time.

## 2017-11-07 NOTE — Discharge Instructions (Addendum)
You have been seen in the Emergency Department (ED) for abdominal pain.  Your evaluation did not identify a clear cause of your symptoms but was generally reassuring.  Abdominal pain has many possible causes. Some aren't serious and get better on their own in a few days. Others need more testing and treatment. If your pain continues or gets worse, you need to be rechecked and may need more tests to find out what is wrong. You may need surgery to correct the problem.   Follow up with your doctor in 12-24 hours if you are still having abdominal pain. Otherwise follow up in 1-3 days for a re-check  Take zofran as needed for nausea and vomiting. Take Miralax 1 cap full twice a day for constipation. Don't ignore new symptoms, such as fever, nausea and vomiting, new or worsening abdominal pain, urination problems, bloody diarrhea or bloody stools, black tarry stools, uncontrollable nausea and vomiting, and dizziness. These may be signs of a more serious problem. If you develop any of these you should be seen by your doctor immediately or return to the ED.   How can you care for yourself at home?  Rest until you feel better.  To prevent dehydration, drink plenty of fluids, enough so that your urine is light yellow or clear like water. Choose water and other caffeine-free clear liquids until you feel better. If you have kidney, heart, or liver disease and have to limit fluids, talk with your doctor before you increase the amount of fluids you drink.  If your stomach is upset, eat mild foods, such as rice, dry toast or crackers, bananas, and applesauce. Try eating several small meals instead of two or three large ones.  Wait until 48 hours after all symptoms have gone away before you have spicy foods, alcohol, and drinks that contain caffeine.  Do not eat foods that are high in fat.  Avoid anti-inflammatory medicines such as aspirin, ibuprofen (Advil, Motrin), and naproxen (Aleve). These can cause stomach  upset. Talk to your doctor if you take daily aspirin for another health problem.  When should you call for help?  Call 911 anytime you think you may need emergency care. For example, call if:  You passed out (lost consciousness).  You pass maroon or very bloody stools.  You vomit blood or what looks like coffee grounds.  You have new, severe belly pain.  Call your doctor now or seek immediate medical care if:  Your pain gets worse, especially if it becomes focused in one area of your belly.  You have a new or higher fever.  Your stools are black and look like tar, or they have streaks of blood.  You have unexpected vaginal bleeding.  You have symptoms of a urinary tract infection. These may include:  Pain when you urinate.  Urinating more often than usual.  Blood in your urine. You are dizzy or lightheaded, or you feel like you may faint. Watch closely for changes in your health, and be sure to contact your doctor if:  You are not getting better after 1 day (24 hours).

## 2017-12-17 ENCOUNTER — Encounter: Payer: Self-pay | Admitting: Emergency Medicine

## 2017-12-17 ENCOUNTER — Emergency Department
Admission: EM | Admit: 2017-12-17 | Discharge: 2017-12-17 | Disposition: A | Discharge status: Home / Self Care | Payer: Self-pay | Attending: Emergency Medicine | Admitting: Emergency Medicine

## 2017-12-17 ENCOUNTER — Other Ambulatory Visit: Payer: Self-pay

## 2017-12-17 DIAGNOSIS — N898 Other specified noninflammatory disorders of vagina: Secondary | ICD-10-CM

## 2017-12-17 DIAGNOSIS — A64 Unspecified sexually transmitted disease: Secondary | ICD-10-CM | POA: Insufficient documentation

## 2017-12-17 DIAGNOSIS — A599 Trichomoniasis, unspecified: Secondary | ICD-10-CM | POA: Insufficient documentation

## 2017-12-17 DIAGNOSIS — F17210 Nicotine dependence, cigarettes, uncomplicated: Secondary | ICD-10-CM | POA: Insufficient documentation

## 2017-12-17 LAB — WET PREP, GENITAL
Clue Cells Wet Prep HPF POC: NONE SEEN
Sperm: NONE SEEN
Yeast Wet Prep HPF POC: NONE SEEN

## 2017-12-17 LAB — POCT PREGNANCY, URINE: Preg Test, Ur: NEGATIVE

## 2017-12-17 LAB — CHLAMYDIA/NGC RT PCR (ARMC ONLY)
CHLAMYDIA TR: NOT DETECTED
N gonorrhoeae: NOT DETECTED

## 2017-12-17 MED ORDER — METRONIDAZOLE 500 MG PO TABS
2000.0000 mg | ORAL_TABLET | Freq: Once | ORAL | Status: AC
  Administered 2017-12-17: 2000 mg via ORAL
  Filled 2017-12-17: qty 4

## 2017-12-17 MED ORDER — AZITHROMYCIN 500 MG PO TABS
1000.0000 mg | ORAL_TABLET | Freq: Once | ORAL | Status: AC
  Administered 2017-12-17: 1000 mg via ORAL
  Filled 2017-12-17: qty 2

## 2017-12-17 MED ORDER — CEFTRIAXONE SODIUM 250 MG IJ SOLR
250.0000 mg | Freq: Once | INTRAMUSCULAR | Status: AC
  Administered 2017-12-17: 250 mg via INTRAMUSCULAR
  Filled 2017-12-17: qty 250

## 2017-12-17 NOTE — ED Provider Notes (Signed)
[facility] Emergency Department Provider Note  ____________________________________________   First MD Initiated Contact with Patient 12/17/17 1625     (approximate)  I have reviewed the triage vital signs and the nursing notes.   HISTORY  Chief Complaint Vaginal Discharge    HPI Maureen Cox is a 22 y.o. female presents to the emergency department complaining of vaginal discharge and itching.  She states she is also had an odor that started about 3 days ago.  She has a history of unprotected sex and just recently started using condoms.  She denies any fever or chills.  She denies any abdominal pain.  She states she has had some cramping but she also had some diarrhea.    History reviewed. No pertinent past medical history.  There are no active problems to display for this patient.   History reviewed. No pertinent surgical history.  Prior to Admission medications   Medication Sig Start Date End Date Taking? Authorizing Provider  amoxicillin (AMOXIL) 500 MG tablet Take 1 tablet (500 mg total) 3 (three) times daily by mouth. 11/26/16   Triplett, Cari B, FNP  ibuprofen (ADVIL,MOTRIN) 600 MG tablet Take 1 tablet (600 mg total) every 6 (six) hours as needed by mouth. 11/26/16   Triplett, Cari B, FNP  ondansetron (ZOFRAN ODT) 4 MG disintegrating tablet Take 1 tablet (4 mg total) by mouth every 8 (eight) hours as needed for nausea or vomiting. 11/07/17   Nita SickleVeronese, [place], MD    Allergies Patient has no known allergies.  No family history on file.  Social History Social History   Tobacco Use  . Smoking status: Current Every Day Smoker    Packs/day: 0.50    Types: Cigarettes  . Smokeless tobacco: Never Used  Substance Use Topics  . Alcohol use: No  . Drug use: No    Review of Systems  Constitutional: No fever/chills Eyes: No visual changes. ENT: No sore throat. Respiratory: Denies cough Genitourinary: Negative for dysuria.  Positive for  vaginal discharge Musculoskeletal: Negative for back pain. Skin: Negative for rash.    ____________________________________________   PHYSICAL EXAM:  VITAL SIGNS: ED Triage Vitals [12/17/17 1600]  Enc Vitals Group     BP 129/72     Pulse Rate (!) 104     Resp 18     Temp 97.8 F (36.6 C)     Temp Source Oral     SpO2 98 %     Weight 250 lb (113.4 kg)     Height 5\' 10"  (1.778 m)     Head Circumference      Peak Flow      Pain Score 8     Pain Loc      Pain Edu?      Excl. in GC?     Constitutional: Alert and oriented. Well appearing and in no acute distress. Eyes: Conjunctivae are normal.  Head: Atraumatic. Nose: No congestion/rhinnorhea. Mouth/Throat: Mucous membranes are moist.   Neck:  supple no lymphadenopathy noted Cardiovascular: Normal rate, regular rhythm.  Respiratory: Normal respiratory effort.  No retractions Abd: soft nontender bs normal all 4 quad GU: External vaginal exam does not show any herpetic lesions or obvious discharge.  Speculum exam shows a white liquidy discharge.  Cervix is within normal limits. Musculoskeletal: FROM all extremities, warm and well perfused Neurologic:  Normal speech and language.  Skin:  Skin is warm, dry and intact. No rash noted. Psychiatric: Mood and affect are normal. Speech and behavior  are normal.  ____________________________________________   LABS (all labs ordered are listed, but only abnormal results are displayed)  Labs Reviewed  WET PREP, GENITAL - Abnormal; Notable for the following components:      Result Value   Trich, Wet Prep PRESENT (*)    WBC, Wet Prep HPF POC TOO NUMEROUS TO COUNT (*)    All other components within normal limits  CHLAMYDIA/NGC RT PCR ([facility] ONLY)  POC URINE PREG, ED  POCT PREGNANCY, URINE    ____________________________________________   ____________________________________________  RADIOLOGY    ____________________________________________   PROCEDURES  Procedure(s) performed: Rocephin 250 IM, Zithromax 1 g p.o., Flagyl 2 g p.o.  Procedures    ____________________________________________   INITIAL IMPRESSION / ASSESSMENT AND PLAN / ED COURSE  Pertinent labs & imaging results that were available during my care of the patient were reviewed by me and considered in my medical decision making (see chart for details).   Patient is 22 year old female presents emergency department with vaginal discharge.  Vaginal exam shows a milky white discharge.  Wet prep shows trichomoniasis and WBCs.  Explained the findings to the patient.  Due to the gonorrhea chlamydia test usually taking 1 to 2 hours we empirically treated her with Rocephin 250 mg IM, Zithromax 1 g p.o., and treatment for the trichomoniasis was Flagyl 2 g p.o.  She was instructed to notify her partner of the infection.  Explained to her they would continue to pass infection back and forth until both parties have been treated.  They should refrain from sex for at least 7 days afterwards.  Follow-up with the health department if needed.  She states she understands will comply.  She is discharged in stable condition.     As part of my medical decision making, I reviewed the following data within the electronic MEDICAL RECORD NUMBER Nursing notes reviewed and incorporated, Labs reviewed wet prep showed trichomoniasis and WBCs, Notes from prior ED visits and [place] Controlled Substance Database  ____________________________________________   FINAL CLINICAL IMPRESSION(S) / ED DIAGNOSES  Final diagnoses:  Trichomoniasis  Vaginal discharge  STD (female)      NEW MEDICATIONS STARTED DURING THIS VISIT:  Discharge Medication List as of 12/17/2017  5:27 PM       Note:  This document was prepared using Dragon  voice recognition software and may include unintentional dictation errors.    Faythe Ghee, PA-C 12/17/17 2014    Phineas Semen, MD 12/17/17 [phone]

## 2017-12-17 NOTE — Discharge Instructions (Addendum)
Follow-up with your regular doctor or the [facility] if the gonorrhea/chlamydia test returns positive.  You will need to recheck to make sure it is not resistant to medications.  Do not have sex with your partner until he is been treated.  You must wait one week after both parties have been treated prior to resuming sexual activity

## 2017-12-17 NOTE — ED Triage Notes (Signed)
Pt arrives with complaints of vaginal itching, discharge, and odor that started 3 days prior. Pt admits to unprotected sex.

## 2018-04-11 ENCOUNTER — Emergency Department: Payer: No Typology Code available for payment source

## 2018-04-11 ENCOUNTER — Other Ambulatory Visit: Payer: Self-pay

## 2018-04-11 ENCOUNTER — Emergency Department
Admission: EM | Admit: 2018-04-11 | Discharge: 2018-04-11 | Disposition: A | Discharge status: Home / Self Care | Payer: No Typology Code available for payment source | Attending: Emergency Medicine | Admitting: Emergency Medicine

## 2018-04-11 ENCOUNTER — Encounter: Payer: Self-pay | Admitting: Emergency Medicine

## 2018-04-11 DIAGNOSIS — F17210 Nicotine dependence, cigarettes, uncomplicated: Secondary | ICD-10-CM | POA: Diagnosis not present

## 2018-04-11 DIAGNOSIS — Y9389 Activity, other specified: Secondary | ICD-10-CM | POA: Insufficient documentation

## 2018-04-11 DIAGNOSIS — S20211A Contusion of right front wall of thorax, initial encounter: Secondary | ICD-10-CM | POA: Insufficient documentation

## 2018-04-11 DIAGNOSIS — S299XXA Unspecified injury of thorax, initial encounter: Secondary | ICD-10-CM | POA: Diagnosis present

## 2018-04-11 DIAGNOSIS — S0990XA Unspecified injury of head, initial encounter: Secondary | ICD-10-CM | POA: Diagnosis not present

## 2018-04-11 DIAGNOSIS — Y92410 Unspecified street and highway as the place of occurrence of the external cause: Secondary | ICD-10-CM | POA: Diagnosis not present

## 2018-04-11 DIAGNOSIS — S8002XA Contusion of left knee, initial encounter: Secondary | ICD-10-CM | POA: Diagnosis not present

## 2018-04-11 DIAGNOSIS — Y999 Unspecified external cause status: Secondary | ICD-10-CM | POA: Insufficient documentation

## 2018-04-11 MED ORDER — MELOXICAM 15 MG PO TABS: 15.0000 mg | ORAL_TABLET | Freq: Every day | ORAL | 0 refills | Status: DC

## 2018-04-11 MED ORDER — METHOCARBAMOL 500 MG PO TABS: 500.0000 mg | ORAL_TABLET | Freq: Four times a day (QID) | ORAL | 0 refills | Status: DC

## 2018-04-11 MED ORDER — HYDROCODONE-ACETAMINOPHEN 5-325 MG PO TABS
1.0000 | ORAL_TABLET | Freq: Once | ORAL | Status: AC
  Administered 2018-04-11: 1 via ORAL
  Filled 2018-04-11: qty 1

## 2018-04-11 NOTE — ED Notes (Signed)
Orders placed per VORB from PA and c-collar placed on patient.  Taken to CT and then will go to xray.
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
## 2018-08-15 ENCOUNTER — Encounter: Payer: Self-pay | Admitting: Physician Assistant

## 2018-08-15 ENCOUNTER — Other Ambulatory Visit: Payer: Self-pay

## 2018-08-15 ENCOUNTER — Ambulatory Visit: Payer: Self-pay | Admitting: Physician Assistant

## 2018-08-15 DIAGNOSIS — B9689 Other specified bacterial agents as the cause of diseases classified elsewhere: Secondary | ICD-10-CM

## 2018-08-15 DIAGNOSIS — N760 Acute vaginitis: Secondary | ICD-10-CM

## 2018-08-15 DIAGNOSIS — Z113 Encounter for screening for infections with a predominantly sexual mode of transmission: Secondary | ICD-10-CM

## 2018-08-15 LAB — WET PREP FOR TRICH, YEAST, CLUE
Trichomonas Exam: NEGATIVE
Yeast Exam: NEGATIVE

## 2018-08-15 MED ORDER — METRONIDAZOLE 500 MG PO TABS: 500.0000 mg | ORAL_TABLET | Freq: Two times a day (BID) | ORAL | 0 refills | Status: DC

## 2018-08-15 NOTE — Progress Notes (Signed)
STI clinic/screening visit  Subjective:  Maureen Cox is a 23 y.o. female being seen today for an STI screening visit. The patient reports they do have symptoms.  Patient has the following medical conditions:  There are no active problems to display for this patient.    Chief Complaint  Patient presents with  . SEXUALLY TRANSMITTED DISEASE    HPI  Patient reports that she has had vaginal odor for 4 days and irregular vaginal bleeding during this month.  Reports that her last normal period was 07/03/18 and then had heavy bleeding for 1 day on 07/26/18, a more normal flow 7/23-7/25, and heavier bleeding yesterday on 08/14/18.  Denies any other symptoms or concerns,.   See flowsheet for further details and programmatic requirements.    The following portions of the patient's history were reviewed and updated as appropriate: allergies, current medications, past medical history, past social history, past surgical history and problem list.  Objective:  There were no vitals filed for this visit.  Physical Exam Constitutional:      General: She is not in acute distress.    Appearance: Normal appearance.  HENT:     Head: Normocephalic and atraumatic.     Mouth/Throat:     Mouth: Mucous membranes are moist.     Pharynx: Oropharynx is clear. No oropharyngeal exudate or posterior oropharyngeal erythema.  Neck:     Musculoskeletal: Neck supple.  Pulmonary:     Effort: Pulmonary effort is normal.  Abdominal:     Palpations: Abdomen is soft. There is no mass.     Tenderness: There is no abdominal tenderness. There is no rebound.  Genitourinary:    General: Normal vulva.     Rectum: Normal.     Comments: External genitalia/pubic area without nits, lice, edema, erythema, lesions, and inguinal adenopathy. Vagina with normal mucosa, moderate amount of menstrual bleeding, cervix without visible lesions. Uterus firm, mobile, nt, no masses, no CMT, no adnexal tenderness or fullness.   Lymphadenopathy:     Cervical: No cervical adenopathy.  Skin:    General: Skin is warm and dry.     Findings: No bruising, erythema, lesion or rash.  Neurological:     Mental Status: She is alert and oriented to person, place, and time.  Psychiatric:        Mood and Affect: Mood normal.        Thought Content: Thought content normal.        Judgment: Judgment normal.       Assessment and Plan:  Maureen CobbleJavica S Nusbaum is a 23 y.o. female [facility]I screening  1. Screening for STD (sexually transmitted disease) Patient is concerned about irregular vaginal bleeding and vaginal odor. Declines blood work today since had it done about 2 months ago and no change of partner. Rec condoms with all sex Await test results.  Counseled that RN will call if she needs to RTC for any further treatment. - WET PREP FOR [place], YEAST, CLUE - Chlamydia/Gonorrhea [facility] - metroNIDAZOLE (FLAGYL) 500 MG tablet; Take 1 tablet (500 mg total) by mouth 2 (two) times daily.  Dispense: 14 tablet; Refill: 0  2. BV (bacterial vaginosis) Will treat for BV after reviewing wet mount. Metronidazole 500mg  #14 1 po BID for 7days with food, no EtOH for 24 hr before and until 72 hr after completing medicine. No sex for 7 days  - metroNIDAZOLE (FLAGYL) 500 MG tablet; Take 1  tablet (500 mg total) by mouth 2 (two) times daily.  Dispense: 14 tablet; Refill: 0     No follow-ups on file.  No future appointments.  Jerene Dilling, PA

## 2018-11-12 ENCOUNTER — Other Ambulatory Visit: Payer: Self-pay

## 2018-11-12 ENCOUNTER — Encounter: Payer: Self-pay | Admitting: Advanced Practice Midwife

## 2018-11-12 ENCOUNTER — Ambulatory Visit: Payer: Self-pay | Admitting: Advanced Practice Midwife

## 2018-11-12 DIAGNOSIS — F1210 Cannabis abuse, uncomplicated: Secondary | ICD-10-CM | POA: Insufficient documentation

## 2018-11-12 DIAGNOSIS — F17200 Nicotine dependence, unspecified, uncomplicated: Secondary | ICD-10-CM | POA: Insufficient documentation

## 2018-11-12 DIAGNOSIS — Z113 Encounter for screening for infections with a predominantly sexual mode of transmission: Secondary | ICD-10-CM

## 2018-11-12 DIAGNOSIS — F4310 Post-traumatic stress disorder, unspecified: Secondary | ICD-10-CM | POA: Insufficient documentation

## 2018-11-12 DIAGNOSIS — F1291 Cannabis use, unspecified, in remission: Secondary | ICD-10-CM | POA: Insufficient documentation

## 2018-11-12 LAB — WET PREP FOR TRICH, YEAST, CLUE
Trichomonas Exam: NEGATIVE
Yeast Exam: NEGATIVE
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
## 2018-11-12 NOTE — Progress Notes (Signed)
Here for STD screening.Daya Dutt Brewer-Jensen, RN 

## 2019-01-22 ENCOUNTER — Encounter: Payer: Self-pay | Admitting: Emergency Medicine

## 2019-01-22 ENCOUNTER — Emergency Department
Admission: EM | Admit: 2019-01-22 | Discharge: 2019-01-22 | Disposition: A | Discharge status: Home / Self Care | Payer: Self-pay | Attending: Emergency Medicine | Admitting: Emergency Medicine

## 2019-01-22 ENCOUNTER — Other Ambulatory Visit: Payer: Self-pay

## 2019-01-22 DIAGNOSIS — G510 Bell's palsy: Secondary | ICD-10-CM | POA: Insufficient documentation

## 2019-01-22 DIAGNOSIS — F17210 Nicotine dependence, cigarettes, uncomplicated: Secondary | ICD-10-CM | POA: Insufficient documentation

## 2019-01-22 DIAGNOSIS — F1210 Cannabis abuse, uncomplicated: Secondary | ICD-10-CM | POA: Insufficient documentation

## 2019-01-22 MED ORDER — PREDNISONE 10 MG (21) PO TBPK: ORAL_TABLET | ORAL | 0 refills | Status: DC

## 2019-01-22 MED ORDER — ACYCLOVIR 400 MG PO TABS: 400.0000 mg | ORAL_TABLET | Freq: Every day | ORAL | 0 refills | Status: AC

## 2019-01-22 NOTE — Discharge Instructions (Signed)
Make sure you take the medication as prescribed and until finished.   Try and establish a primary care provider.   If you are not improving over the week and you have taken your medications, please call and request an appointment with neurology if unable to see primary care.  Return to the ER for symptoms of concern.

## 2019-01-22 NOTE — ED Triage Notes (Signed)
Pt started 2 days ago with left facial paralysis. Unable to raise left eye brow.  No medical hx. No fever. No pain.    Ok for D pod per dr Fuller Plan.
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
## 2019-02-20 ENCOUNTER — Emergency Department
Admission: EM | Admit: 2019-02-20 | Discharge: 2019-02-20 | Disposition: A | Discharge status: Home / Self Care | Payer: Self-pay | Attending: Emergency Medicine | Admitting: Emergency Medicine

## 2019-02-20 ENCOUNTER — Other Ambulatory Visit: Payer: Self-pay

## 2019-02-20 DIAGNOSIS — Z3A00 Weeks of gestation of pregnancy not specified: Secondary | ICD-10-CM | POA: Insufficient documentation

## 2019-02-20 DIAGNOSIS — Z3490 Encounter for supervision of normal pregnancy, unspecified, unspecified trimester: Secondary | ICD-10-CM | POA: Insufficient documentation

## 2019-02-20 DIAGNOSIS — T40601A Poisoning by unspecified narcotics, accidental (unintentional), initial encounter: Secondary | ICD-10-CM | POA: Insufficient documentation

## 2019-02-20 DIAGNOSIS — O99331 Smoking (tobacco) complicating pregnancy, first trimester: Secondary | ICD-10-CM | POA: Insufficient documentation

## 2019-02-20 DIAGNOSIS — O99321 Drug use complicating pregnancy, first trimester: Secondary | ICD-10-CM | POA: Insufficient documentation

## 2019-02-20 DIAGNOSIS — F17210 Nicotine dependence, cigarettes, uncomplicated: Secondary | ICD-10-CM | POA: Insufficient documentation

## 2019-02-20 DIAGNOSIS — F1210 Cannabis abuse, uncomplicated: Secondary | ICD-10-CM | POA: Insufficient documentation

## 2019-02-20 LAB — ETHANOL: Alcohol, Ethyl (B): <10 mg/dL (ref <10)

## 2019-02-20 LAB — COMPREHENSIVE METABOLIC PANEL
ALT: 60 U/L — ABNORMAL HIGH (ref 0–44)
AST: 54 U/L — ABNORMAL HIGH (ref 15–41)
Albumin: 3.8 g/dL (ref 3.5–5.0)
Alkaline Phosphatase: 74 U/L (ref 38–126)
Anion gap: 8 (ref 5–15)
BUN: 7 mg/dL (ref 6–20)
CO2: 27 mmol/L (ref 22–32)
Calcium: 8.8 mg/dL — ABNORMAL LOW (ref 8.9–10.3)
Chloride: 103 mmol/L (ref 98–111)
Creatinine, Ser: 0.97 mg/dL (ref 0.44–1.00)
GFR calc Af Amer: >60 mL/min (ref >60)
GFR calc non Af Amer: >60 mL/min (ref >60)
Glucose, Bld: 111 mg/dL — ABNORMAL HIGH (ref 70–99)
Potassium: 3.2 mmol/L — ABNORMAL LOW (ref 3.5–5.1)
Sodium: 138 mmol/L (ref 135–145)
Total Bilirubin: 0.4 mg/dL (ref 0.3–1.2)
Total Protein: 7.2 g/dL (ref 6.5–8.1)

## 2019-02-20 LAB — CBC
HCT: 41 % (ref 36.0–46.0)
Hemoglobin: 13.6 g/dL (ref 12.0–15.0)
MCH: 28.5 pg (ref 26.0–34.0)
MCHC: 33.2 g/dL (ref 30.0–36.0)
MCV: 86 fL (ref 80.0–100.0)
Platelets: 411 10*3/uL — ABNORMAL HIGH (ref 150–400)
RBC: 4.77 MIL/uL (ref 3.87–5.11)
RDW: 13.2 % (ref 11.5–15.5)
WBC: 7.6 10*3/uL (ref 4.0–10.5)
nRBC: 0 % (ref 0.0–0.2)

## 2019-02-20 LAB — URINE DRUG SCREEN, QUALITATIVE (ARMC ONLY)
Amphetamines, Ur Screen: NOT DETECTED
Barbiturates, Ur Screen: NOT DETECTED
Benzodiazepine, Ur Scrn: NOT DETECTED
Cannabinoid 50 Ng, Ur ~~LOC~~: POSITIVE — AB
Cocaine Metabolite,Ur ~~LOC~~: NOT DETECTED
MDMA (Ecstasy)Ur Screen: NOT DETECTED
Methadone Scn, Ur: NOT DETECTED
Opiate, Ur Screen: NOT DETECTED
Phencyclidine (PCP) Ur S: NOT DETECTED
Tricyclic, Ur Screen: NOT DETECTED

## 2019-02-20 LAB — HCG, QUANTITATIVE, PREGNANCY: hCG, Beta Chain, Quant, S: 6680 m[IU]/mL — ABNORMAL HIGH (ref <5)

## 2019-02-20 LAB — POCT PREGNANCY, URINE: Preg Test, Ur: POSITIVE — AB

## 2019-02-20 MED ORDER — ONDANSETRON HCL 4 MG/2ML IJ SOLN
4.0000 mg | Freq: Once | INTRAMUSCULAR | Status: AC
  Administered 2019-02-20: 4 mg via INTRAVENOUS
  Filled 2019-02-20: qty 2
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
## 2019-02-20 NOTE — ED Triage Notes (Signed)
Pt arrives via [facility] from home w cc of OD. Pt reports taking 30mg  precocet and drinking some wine. On EMS arrival pt had RR 4/min and was given 2 narcan.   Pt possibly pregnant- has had 5 positive tests at home, LMP dec.  No PMH, NKA

## 2019-02-20 NOTE — ED Notes (Signed)
Pt ambulated to bathroom w/o difficulty. O2 sats remain above 95% at this time on room air. Pt requesting water to drink and warm blanket.

## 2019-03-05 ENCOUNTER — Encounter: Payer: Self-pay | Admitting: Advanced Practice Midwife

## 2019-03-22 LAB — OB RESULTS CONSOLE HIV ANTIBODY (ROUTINE TESTING): HIV: NONREACTIVE

## 2019-03-22 LAB — OB RESULTS CONSOLE RUBELLA ANTIBODY, IGM: Rubella: IMMUNE

## 2019-03-22 LAB — OB RESULTS CONSOLE HEPATITIS B SURFACE ANTIGEN: Hepatitis B Surface Ag: NEGATIVE

## 2019-04-18 DIAGNOSIS — D573 Sickle-cell trait: Secondary | ICD-10-CM | POA: Diagnosis present

## 2019-04-25 ENCOUNTER — Other Ambulatory Visit: Payer: Self-pay

## 2019-04-25 ENCOUNTER — Encounter: Payer: Self-pay | Admitting: Emergency Medicine

## 2019-04-25 ENCOUNTER — Emergency Department: Payer: Medicaid Other

## 2019-04-25 ENCOUNTER — Emergency Department
Admission: EM | Admit: 2019-04-25 | Discharge: 2019-04-25 | Disposition: A | Discharge status: Home / Self Care | Payer: Medicaid Other | Attending: Emergency Medicine | Admitting: Emergency Medicine

## 2019-04-25 DIAGNOSIS — W010XXA Fall on same level from slipping, tripping and stumbling without subsequent striking against object, initial encounter: Secondary | ICD-10-CM | POA: Diagnosis not present

## 2019-04-25 DIAGNOSIS — O9A212 Injury, poisoning and certain other consequences of external causes complicating pregnancy, second trimester: Secondary | ICD-10-CM | POA: Diagnosis not present

## 2019-04-25 DIAGNOSIS — W19XXXA Unspecified fall, initial encounter: Secondary | ICD-10-CM

## 2019-04-25 DIAGNOSIS — S83412A Sprain of medial collateral ligament of left knee, initial encounter: Secondary | ICD-10-CM | POA: Diagnosis not present

## 2019-04-25 DIAGNOSIS — Y92009 Unspecified place in unspecified non-institutional (private) residence as the place of occurrence of the external cause: Secondary | ICD-10-CM

## 2019-04-25 DIAGNOSIS — O2312 Infections of bladder in pregnancy, second trimester: Secondary | ICD-10-CM | POA: Diagnosis not present

## 2019-04-25 DIAGNOSIS — F17210 Nicotine dependence, cigarettes, uncomplicated: Secondary | ICD-10-CM | POA: Diagnosis not present

## 2019-04-25 DIAGNOSIS — O99330 Smoking (tobacco) complicating pregnancy, unspecified trimester: Secondary | ICD-10-CM | POA: Insufficient documentation

## 2019-04-25 DIAGNOSIS — M545 Low back pain: Secondary | ICD-10-CM | POA: Insufficient documentation

## 2019-04-25 DIAGNOSIS — Y999 Unspecified external cause status: Secondary | ICD-10-CM | POA: Diagnosis not present

## 2019-04-25 DIAGNOSIS — Y939 Activity, unspecified: Secondary | ICD-10-CM | POA: Diagnosis not present

## 2019-04-25 DIAGNOSIS — Y929 Unspecified place or not applicable: Secondary | ICD-10-CM | POA: Diagnosis not present

## 2019-04-25 DIAGNOSIS — Z3A15 15 weeks gestation of pregnancy: Secondary | ICD-10-CM | POA: Insufficient documentation

## 2019-04-25 LAB — URINALYSIS, COMPLETE (UACMP) WITH MICROSCOPIC
Bacteria, UA: NONE SEEN
Bilirubin Urine: NEGATIVE
Glucose, UA: NEGATIVE mg/dL
Hgb urine dipstick: NEGATIVE
Ketones, ur: 20 mg/dL — AB
Nitrite: NEGATIVE
Protein, ur: 30 mg/dL — AB
Specific Gravity, Urine: 1.012 (ref 1.005–1.030)
WBC, UA: >50 WBC/hpf — ABNORMAL HIGH (ref 0–5)
pH: 6 (ref 5.0–8.0)

## 2019-04-25 MED ORDER — CEPHALEXIN 500 MG PO CAPS: 500.0000 mg | ORAL_CAPSULE | Freq: Two times a day (BID) | ORAL | 0 refills | Status: AC

## 2019-04-25 NOTE — ED Provider Notes (Signed)
[facility] Emergency Department Provider Note ____________________________________________  Time seen: 1649  I have reviewed the triage vital signs and the nursing notes.  HISTORY  Chief Complaint  Fall  HPI Maureen Cox is a 24 y.o. female G1,P0,  presents to the ED for evaluation of injury sustained following mechanical fall.  Patient who is approximately  [redacted] weeks pregnant with single gestation, presents with pain to the left knee and the lower back.  She denies any head injury, loss of consciousness, abdominal trauma/pain, dysuria, or vaginal bleeding.  History reviewed. No pertinent past medical history.  Patient Active Problem List   Diagnosis Date Noted  . Morbid obesity ([place]) 210 lbs 11/12/2018  . PTSD (post-traumatic stress disorder) 2020 11/12/2018  . Smoker 1/2-1.5 ppd 11/12/2018  . Marijuana abuse 11/12/2018    History reviewed. No pertinent surgical history.  Prior to Admission medications   Not on File    Allergies Patient has no known allergies.  History reviewed. No pertinent family history.  Social History Social History   Tobacco Use  . Smoking status: Current Every Day Smoker    Packs/day: 1.50    Types: Cigarettes  . Smokeless tobacco: Never Used  Substance Use Topics  . Alcohol use: Yes    Alcohol/week: 12.0 standard drinks    Types: 12 Shots of liquor per week    Comment: qo weekend  . Drug use: Yes    Types: Marijuana    Comment: last use today    Review of Systems  Constitutional: Negative for fever. Cardiovascular: Negative for chest pain. Respiratory: Negative for shortness of breath. Gastrointestinal: Negative for abdominal pain, vomiting and diarrhea. Genitourinary: Negative for dysuria.  Denies vaginal bleeding. Musculoskeletal: Positive for lower back pain.  Reports left knee pain. Skin: Negative for rash. Neurological: Negative for headaches, focal weakness or  numbness. ____________________________________________  PHYSICAL EXAM:  VITAL SIGNS: ED Triage Vitals  Enc Vitals Group     BP 04/25/19 1627 113/85     Pulse Rate 04/25/19 1627 69     Resp 04/25/19 1627 18     Temp 04/25/19 1627 98.3 F (36.8 C)     Temp Source 04/25/19 1627 Oral     SpO2 04/25/19 1627 98 %     Weight 04/25/19 1629 218 lb (98.9 kg)     Height 04/25/19 1629 5\' 9"  (1.753 m)     Head Circumference --      Peak Flow --      Pain Score 04/25/19 1628 8     Pain Loc --      Pain Edu? --      Excl. in [place]? --     Constitutional: Alert and oriented. Well appearing and in no distress. Head: Normocephalic and atraumatic. Eyes: Conjunctivae are normal. Normal extraocular movements Cardiovascular: Normal rate, regular rhythm. Normal distal pulses. Respiratory: Normal respiratory effort. No wheezes/rales/rhonchi. Gastrointestinal: Soft and nontender. No distention, bowel, guarding, or rigidity. Musculoskeletal: Left knee without obvious deformity, effusion, or dislocation.  Patient with normal active range of motion to the knee with flexion extension.  She tender to palpation to the medial joint.  There is some medial tenderness with valgus joint stress.  No popliteal space fullness is noted.  No calf or Achilles tenderness noted distally.  Normal patella tracking without ballottement.  Normal spinal alignment without midline tenderness, spasm, deformity, or step-off.  Patient tender to the paraspinal musculature of the lumbar sacral junction.  Nontender with normal range of motion in  all extremities.  Neurologic: Antalgic gait favoring the left knee, without ataxia. Normal speech and language. No gross focal neurologic deficits are appreciated. Skin:  Skin is warm, dry and intact. No rash noted. ____________________________________________   LABS (pertinent positives/negatives) Labs Reviewed  URINALYSIS, COMPLETE (UACMP) WITH MICROSCOPIC - Abnormal; Notable for the following  components:      Result Value   Color, Urine YELLOW (*)    APPearance TURBID (*)    Ketones, ur 20 (*)    Protein, ur 30 (*)    Leukocytes,Ua LARGE (*)    WBC, UA >50 (*)    All other components within normal limits  ____________________________________________   RADIOLOGY  DG Left Knee  Negative ____________________________________________  PROCEDURES  FHTs 138 bpm by doppler Ace bandage - left knee  Procedures ____________________________________________  INITIAL IMPRESSION / ASSESSMENT AND PLAN / ED COURSE  Gravid patient at [redacted] weeks gestation presents for evaluation of injury following a mechanical fall at home. Her primary complaint was of left knee pain and lower back pain. Her exam was benign and reassuring. There was no evidence of acute fracture, dislocation, or internal derangement to the left knee. FHTs were found in the high 130s. Patient is placed in an ace bandage and encouraged to take ES Tylenol as needed. She will follow[facility] with [facility] for routine and prenatal care.  Maureen Cox was evaluated in Emergency Department on 04/25/2019 for the symptoms described in the history of present illness. She was evaluated in the context of the global COVID-19 pandemic, which necessitated consideration that the patient might be at risk for infection with the SARS-CoV-2 virus that causes COVID-19. Institutional protocols and algorithms that pertain to the evaluation of patients at risk for COVID-19 are in a state of rapid change based on information released by regulatory bodies including the CDC and federal and state organizations. These policies and algorithms were followed during the patient's care in the ED. ____________________________________________  FINAL CLINICAL IMPRESSION(S) / ED DIAGNOSES  Final diagnoses:  Fall in home, initial encounter  Sprain of medial collateral ligament of left knee, initial encounter  Cystitis of pregnancy in second trimester       Maureen Cox, Maureen Ivory, PA-C 04/26/19 1203    Maureen Filbert, MD 05/01/19 1529

## 2019-04-25 NOTE — Discharge Instructions (Addendum)
Your exam and XR were reassuring. There is no evidence of a fracture. The baby has normal heart tones on exam with the doppler. You are being treated for a UTI, since your urine shows some signs of infection. Take the antibiotic as directed. Take OTC Extra Strength Tylenol for pain. Apply ice to reduce pain and swelling to the knee. Follow-up with [facility] for medical care.

## 2019-04-25 NOTE — ED Notes (Signed)
See triage note  Presents s/p fall   States she slipped down her steps   Having some pain to lower back  But states main pain is to lateral left knee  No swelling noted  Pt is [redacted] weeks pregnant  No vaginal bleeding

## 2019-04-25 NOTE — ED Triage Notes (Signed)
Pt fell down 10 stairs. Only c/o left knee and lower back pain.  No abdominal pain.  No vaginal bleeding. [redacted] weeks pregnant. G1P0.  NAD. Unlabored. Drinking drink in triage.

## 2019-05-16 DIAGNOSIS — F321 Major depressive disorder, single episode, moderate: Secondary | ICD-10-CM | POA: Insufficient documentation

## 2019-06-19 ENCOUNTER — Observation Stay
Admission: EM | Admit: 2019-06-19 | Discharge: 2019-06-19 | Disposition: A | Discharge status: Home / Self Care | Payer: Medicaid Other | Attending: Obstetrics and Gynecology | Admitting: Obstetrics and Gynecology

## 2019-06-19 ENCOUNTER — Encounter: Payer: Self-pay | Admitting: Obstetrics and Gynecology

## 2019-06-19 ENCOUNTER — Other Ambulatory Visit: Payer: Self-pay

## 2019-06-19 DIAGNOSIS — F17210 Nicotine dependence, cigarettes, uncomplicated: Secondary | ICD-10-CM | POA: Diagnosis not present

## 2019-06-19 DIAGNOSIS — O99332 Smoking (tobacco) complicating pregnancy, second trimester: Secondary | ICD-10-CM | POA: Insufficient documentation

## 2019-06-19 DIAGNOSIS — R109 Unspecified abdominal pain: Secondary | ICD-10-CM | POA: Diagnosis present

## 2019-06-19 DIAGNOSIS — O26892 Other specified pregnancy related conditions, second trimester: Secondary | ICD-10-CM | POA: Diagnosis present

## 2019-06-19 DIAGNOSIS — M549 Dorsalgia, unspecified: Secondary | ICD-10-CM | POA: Insufficient documentation

## 2019-06-19 DIAGNOSIS — R1030 Lower abdominal pain, unspecified: Secondary | ICD-10-CM | POA: Diagnosis not present

## 2019-06-19 DIAGNOSIS — Z3A23 23 weeks gestation of pregnancy: Secondary | ICD-10-CM | POA: Insufficient documentation

## 2019-06-19 LAB — URINE DRUG SCREEN, QUALITATIVE (ARMC ONLY)
Amphetamines, Ur Screen: NOT DETECTED
Barbiturates, Ur Screen: NOT DETECTED
Benzodiazepine, Ur Scrn: NOT DETECTED
Cannabinoid 50 Ng, Ur ~~LOC~~: POSITIVE — AB
Cocaine Metabolite,Ur ~~LOC~~: NOT DETECTED
MDMA (Ecstasy)Ur Screen: NOT DETECTED
Methadone Scn, Ur: NOT DETECTED
Opiate, Ur Screen: POSITIVE — AB
Phencyclidine (PCP) Ur S: NOT DETECTED
Tricyclic, Ur Screen: NOT DETECTED

## 2019-06-19 LAB — URINALYSIS, COMPLETE (UACMP) WITH MICROSCOPIC
Bacteria, UA: NONE SEEN
Bilirubin Urine: NEGATIVE
Glucose, UA: NEGATIVE mg/dL
Hgb urine dipstick: NEGATIVE
Ketones, ur: NEGATIVE mg/dL
Nitrite: NEGATIVE
Protein, ur: 30 mg/dL — AB
Specific Gravity, Urine: 1.021 (ref 1.005–1.030)
Squamous Epithelial / HPF: >50 — ABNORMAL HIGH (ref 0–5)
WBC, UA: >50 WBC/hpf — ABNORMAL HIGH (ref 0–5)
pH: 5 (ref 5.0–8.0)

## 2019-06-19 MED ORDER — CYCLOBENZAPRINE HCL 10 MG PO TABS
10.0000 mg | ORAL_TABLET | Freq: Three times a day (TID) | ORAL | Status: DC | PRN
  Filled 2019-06-19: qty 1

## 2019-06-19 MED ORDER — ACETAMINOPHEN 500 MG PO TABS: 1000.0000 mg | ORAL_TABLET | Freq: Four times a day (QID) | ORAL | 0 refills | Status: DC

## 2019-06-19 MED ORDER — ACETAMINOPHEN 500 MG PO TABS
1000.0000 mg | ORAL_TABLET | Freq: Four times a day (QID) | ORAL | Status: DC
  Administered 2019-06-19: 1000 mg via ORAL
  Filled 2019-06-19: qty 2

## 2019-06-19 MED ORDER — NITROFURANTOIN MONOHYD MACRO 100 MG PO CAPS: 100.0000 mg | ORAL_CAPSULE | Freq: Two times a day (BID) | ORAL | 0 refills | Status: AC

## 2019-06-19 NOTE — Discharge Summary (Signed)
Maureen Cox is a 24 y.o. female. She is at [redacted]w[redacted]d gestation. Patient's last menstrual period was 01/09/2019. Estimated Date of Delivery: 10/13/19  Prenatal care site:  [facility]- no prenatal records available.   Current pregnancy complicated by: 1. Obesity 2. Hx PTSD and depression, pt self DC meds.  3. Multiple ER visits noted in records during pregnancy 4. Hx drug use, Opiates, MJ and cocaine per ER records, pt denies use since knowledge of pregnancy.   Anatomy US done via [facility]: 05/30/19: IUP at 20+4wks, posterior placenta, normal fluid, cephalic presentation, normal anatomy, FHR 143bpm.  Negative cell free DNA screening.   Chief complaint: lower abdominal pain and back pain  Onset/timing: since 6/2 after midnight, occurred after lifting a heavy tote.  Duration: constant Quality: aching pelvic and back pain Severity: pain now is 4/10 and better.  Aggravating or alleviating conditions: tried a pill, sometime this am but feels that it made her discomfort worse and made her "not feel right". Thought it was a tylenol.  Associated signs/symptoms: denies LOF, VB or UCs. + FM reported.    S: Resting comfortably. no CTX, no VB.no LOF,  Active fetal movement.  Denies: HA, visual changes, SOB, or RUQ/epigastric pain  Maternal Medical History:  History reviewed. No pertinent past medical history.  History reviewed. No pertinent surgical history.  No Known Allergies  Prior to Admission medications   Medication Sig Start Date End Date Taking? Authorizing Provider  Prenatal Vit-Fe Fumarate-FA (PRENATAL MULTIVITAMIN) TABS tablet Take 1 tablet by mouth daily at 12 noon.   Yes [provider]      Social History: She  reports that she has been smoking cigarettes. She has been smoking about 1.50 packs per day. She has never used smokeless tobacco. She reports previous alcohol use of about 12.0 standard drinks of alcohol per week. She reports previous drug use. Drug:  Marijuana.  Family History: no history of gyn cancers  Review of Systems: A full review of systems was performed and negative except as noted in the HPI.     O:  BP 115/62 (BP Location: Right Arm)    Pulse 81    Temp 98.5 F (36.9 C) (Oral)    Resp 18    Ht 5\' 9"  (1.753 m)    Wt 98.4 kg    LMP 01/09/2019    BMI 32.05 kg/m  Results for orders placed or performed during the hospital encounter of 06/19/19 (from the past 48 hour(s))  Urinalysis, Complete w Microscopic   Collection Time: 06/19/19  8:41 PM  Result Value Ref Range   Color, Urine AMBER (A) YELLOW   APPearance TURBID (A) CLEAR   Specific Gravity, Urine 1.021 1.005 - 1.030   pH 5.0 5.0 - 8.0   Glucose, UA NEGATIVE NEGATIVE mg/dL   Hgb urine dipstick NEGATIVE NEGATIVE   Bilirubin Urine NEGATIVE NEGATIVE   Ketones, ur NEGATIVE NEGATIVE mg/dL   Protein, ur 30 (A) NEGATIVE mg/dL   Nitrite NEGATIVE NEGATIVE   Leukocytes,Ua LARGE (A) NEGATIVE   RBC / HPF 11-20 0 - 5 RBC/hpf   WBC, UA >50 (H) 0 - 5 WBC/hpf   Bacteria, UA NONE SEEN NONE SEEN   Squamous Epithelial / LPF >50 (H) 0 - 5   WBC Clumps PRESENT    Mucus PRESENT   Urine Drug Screen, Qualitative ([facility] only)   Collection Time: 06/19/19  8:41 PM  Result Value Ref Range   Tricyclic, Ur Screen NONE DETECTED NONE DETECTED  Amphetamines, Ur Screen NONE DETECTED NONE DETECTED   MDMA (Ecstasy)Ur Screen NONE DETECTED NONE DETECTED   Cocaine Metabolite,Ur [place] NONE DETECTED NONE DETECTED   Opiate, Ur Screen POSITIVE (A) NONE DETECTED   Phencyclidine (PCP) Ur S NONE DETECTED NONE DETECTED   Cannabinoid 50 Ng, Ur [place] POSITIVE (A) NONE DETECTED   Barbiturates, Ur Screen NONE DETECTED NONE DETECTED   Benzodiazepine, Ur Scrn NONE DETECTED NONE DETECTED   Methadone Scn, Ur NONE DETECTED NONE DETECTED     Constitutional: NAD, AAOx3  HE/ENT: extraocular movements grossly intact, moist mucous membranes CV: RRR PULM: nl respiratory effort, CTABL     Abd: gravid, non-tender,  non-distended, soft      Ext: Non-tender, Nonedematous   Psych: mood appropriate, speech normal Pelvic: deferred  Fetal  monitoring: 135bpm via Doppler  TOCO: no UCs noted or palpated    A/P: 24 y.o. [redacted]w[redacted]d here for antenatal surveillance for back and abd pain  Principle Diagnosis:  23wks, ligament pain   Preterm labor: not present.   Fetal Wellbeing: doppler FHR appropriate, NO UCs noted. .  Suspect possible UTI vs contaminated sample, Macrobid to pharmacy  Reviewed comfort measures for ligament pain- support belt and tylenol prn.   Reviewed Pos urine drug screen, discussed importance of avoiding illicit substances for potential danger to fetus.   D/c home stable, precautions reviewed, follow-up as scheduled.    Francetta Found, CNM 06/19/2019  9:54 PM

## 2019-06-19 NOTE — OB Triage Note (Signed)
Patient came in for observation for lower abdominal and back  pain 8/10 that started at 0000 after picking up a heavy bin. Patient reports +FM upon arrival. Patient denies vaginal spotting/bleeding. Patient denies uterine contractions. Patient denies leaking of fluid but denies vaginal bleeding and spotting. Vital signs stable and patient afebrile. FHR doppler 135. Patient in room by herself. Monitors applied and assessing.

## 2019-06-21 LAB — URINE CULTURE: Culture: <10000 — AB

## 2019-07-30 LAB — OB RESULTS CONSOLE RPR: RPR: NONREACTIVE

## 2019-09-08 ENCOUNTER — Observation Stay: Payer: Medicaid Other

## 2019-09-08 ENCOUNTER — Encounter: Payer: Self-pay | Admitting: Obstetrics and Gynecology

## 2019-09-08 ENCOUNTER — Observation Stay
Admission: EM | Admit: 2019-09-08 | Discharge: 2019-09-08 | Disposition: A | Discharge status: Home / Self Care | Payer: Medicaid Other

## 2019-09-08 ENCOUNTER — Other Ambulatory Visit: Payer: Self-pay

## 2019-09-08 DIAGNOSIS — O26899 Other specified pregnancy related conditions, unspecified trimester: Secondary | ICD-10-CM | POA: Diagnosis not present

## 2019-09-08 DIAGNOSIS — Z3A35 35 weeks gestation of pregnancy: Secondary | ICD-10-CM | POA: Diagnosis not present

## 2019-09-08 DIAGNOSIS — R102 Pelvic and perineal pain: Secondary | ICD-10-CM | POA: Diagnosis present

## 2019-09-08 DIAGNOSIS — O26843 Uterine size-date discrepancy, third trimester: Secondary | ICD-10-CM

## 2019-09-08 DIAGNOSIS — O368190 Decreased fetal movements, unspecified trimester, not applicable or unspecified: Secondary | ICD-10-CM

## 2019-09-08 HISTORY — DX: Post-traumatic stress disorder, unspecified: F43.10

## 2019-09-08 HISTORY — DX: Depression, unspecified: F32.A

## 2019-09-08 LAB — URINALYSIS, COMPLETE (UACMP) WITH MICROSCOPIC
Bilirubin Urine: NEGATIVE
Glucose, UA: NEGATIVE mg/dL
Ketones, ur: NEGATIVE mg/dL
Nitrite: NEGATIVE
Protein, ur: NEGATIVE mg/dL
Specific Gravity, Urine: 1.003 — ABNORMAL LOW (ref 1.005–1.030)
WBC, UA: >50 WBC/hpf — ABNORMAL HIGH (ref 0–5)
pH: 6 (ref 5.0–8.0)

## 2019-09-08 LAB — WET PREP, GENITAL
Clue Cells Wet Prep HPF POC: NONE SEEN
Sperm: NONE SEEN
Trich, Wet Prep: NONE SEEN
Yeast Wet Prep HPF POC: NONE SEEN

## 2019-09-08 LAB — URINE DRUG SCREEN, QUALITATIVE (ARMC ONLY)
Amphetamines, Ur Screen: NOT DETECTED
Barbiturates, Ur Screen: NOT DETECTED
Benzodiazepine, Ur Scrn: NOT DETECTED
Cannabinoid 50 Ng, Ur ~~LOC~~: NOT DETECTED
Cocaine Metabolite,Ur ~~LOC~~: NOT DETECTED
MDMA (Ecstasy)Ur Screen: NOT DETECTED
Methadone Scn, Ur: NOT DETECTED
Opiate, Ur Screen: NOT DETECTED
Phencyclidine (PCP) Ur S: NOT DETECTED
Tricyclic, Ur Screen: NOT DETECTED

## 2019-09-08 LAB — CHLAMYDIA/NGC RT PCR (ARMC ONLY)
Chlamydia Tr: NOT DETECTED
N gonorrhoeae: NOT DETECTED

## 2019-09-08 MED ORDER — CALCIUM CARBONATE ANTACID 500 MG PO CHEW: 2.0000 | CHEWABLE_TABLET | ORAL | Status: DC | PRN

## 2019-09-08 MED ORDER — ACETAMINOPHEN 500 MG PO TABS: 1000.0000 mg | ORAL_TABLET | Freq: Four times a day (QID) | ORAL | Status: DC | PRN

## 2019-09-08 MED ORDER — LACTATED RINGERS IV BOLUS
1000.0000 mL | Freq: Once | INTRAVENOUS | Status: AC
  Administered 2019-09-08: 1000 mL via INTRAVENOUS

## 2019-09-08 NOTE — Discharge Summary (Signed)
Maureen Cox is a 24 y.o. female. She is at [redacted]w[redacted]d gestation. Patient's last menstrual period was 01/09/2019. Estimated Date of Delivery: 10/13/19  P[facility] care site: [facility]  Chief complaint: pelvic pain   Location: suprapubic Onset/timing: started today Duration: intermittent Quality: cramping, achy  Severity: moderate  Aggravating or alleviating conditions: worse with movement  Associated signs/symptoms: denies LOF, vaginal bleeding, or contractions, endorses good feta movement Context: Amyri presents today with lower abdominal pain and back pain with pelvic pain.    S: Resting comfortably. no CTX, no VB.no LOF,  Active fetal movement now but states movement was decreased early today.    Maternal Medical History:  Past Medical Hx:  has a past medical history of Depression and PTSD (post-traumatic stress disorder).    Past Surgical Hx: Denies past surgical history.    No Known Allergies   Prior to Admission medications   Medication Sig Start Date End Date Taking? Authorizing Provider  acetaminophen (TYLENOL) 500 MG tablet Take 2 tablets (1,000 mg total) by mouth every 6 (six) hours. 06/20/19  Yes McVey, Prudencio Pair, CNM  Prenatal Vit-Fe Fumarate-FA (PRENATAL MULTIVITAMIN) TABS tablet Take 1 tablet by mouth daily at 12 noon.   Yes [provider]    Social History: She  reports that she has been smoking cigarettes. She has been smoking about 0.50 packs per day. She has never used smokeless tobacco. She reports previous alcohol use of about 12.0 standard drinks of alcohol per week. She reports current drug use. Drugs: Marijuana and Oxycodone.  Family History: Denies pertinent history, no history of gyn cancers  Review of Systems: A full review of systems was performed and negative except as noted in the HPI.    O:  BP 120/71   Pulse 81   Temp 98.6 F (37 C) (Oral)   LMP 01/09/2019  Results for orders placed or performed during the hospital encounter of 09/08/19  (from the past 48 hour(s))  Urinalysis, Complete w Microscopic   Collection Time: 09/08/19  6:04 PM  Result Value Ref Range   Color, Urine YELLOW (A) YELLOW   APPearance CLOUDY (A) CLEAR   Specific Gravity, Urine 1.003 (L) 1.005 - 1.030   pH 6.0 5.0 - 8.0   Glucose, UA NEGATIVE NEGATIVE mg/dL   Hgb urine dipstick LARGE (A) NEGATIVE   Bilirubin Urine NEGATIVE NEGATIVE   Ketones, ur NEGATIVE NEGATIVE mg/dL   Protein, ur NEGATIVE NEGATIVE mg/dL   Nitrite NEGATIVE NEGATIVE   Leukocytes,Ua LARGE (A) NEGATIVE   RBC / HPF 0-5 0 - 5 RBC/hpf   WBC, UA >50 (H) 0 - 5 WBC/hpf   Bacteria, UA MANY (A) NONE SEEN   Squamous Epithelial / LPF 0-5 0 - 5   WBC Clumps PRESENT    Mucus PRESENT   Urine Drug Screen, Qualitative ([facility] only)   Collection Time: 09/08/19  6:04 PM  Result Value Ref Range   Tricyclic, Ur Screen NONE DETECTED NONE DETECTED   Amphetamines, Ur Screen NONE DETECTED NONE DETECTED   MDMA (Ecstasy)Ur Screen NONE DETECTED NONE DETECTED   Cocaine Metabolite,Ur [place] NONE DETECTED NONE DETECTED   Opiate, Ur Screen NONE DETECTED NONE DETECTED   Phencyclidine (PCP) Ur S NONE DETECTED NONE DETECTED   Cannabinoid 50 Ng, Ur [place] NONE DETECTED NONE DETECTED   Barbiturates, Ur Screen NONE DETECTED NONE DETECTED   Benzodiazepine, Ur Scrn NONE DETECTED NONE DETECTED   Methadone Scn, Ur NONE DETECTED NONE DETECTED  Wet prep, genital   Collection Time:  09/08/19  7:44 PM   Specimen: Cervix  Result Value Ref Range   Yeast Wet Prep HPF POC NONE SEEN NONE SEEN   Trich, Wet Prep NONE SEEN NONE SEEN   Clue Cells Wet Prep HPF POC NONE SEEN NONE SEEN   WBC, Wet Prep HPF POC FEW (A) NONE SEEN   Sperm NONE SEEN   Chlamydia/NGC rt PCR ([facility] only)   Collection Time: 09/08/19  7:44 PM   Specimen: Cervix; Genital  Result Value Ref Range   Specimen source GC/Chlam URINE, RANDOM    Chlamydia Tr NOT DETECTED NOT DETECTED   N gonorrhoeae NOT DETECTED NOT DETECTED     US OB Comp + 14 Wk  Result Date:  09/08/2019 CLINICAL DATA:  Decreased fetal movement, uterine size and date discrepancy EXAM: OBSTETRICAL ULTRASOUND >14 WKS AND BIOPHYSICAL PROFILE FINDINGS: Number of Fetuses: 1 Heart Rate:  147 bpm Movement: Yes Presentation: Cephalic Previa: Not evaluated on this exam Placental Location: Posterior Amniotic Fluid (Subjective): Normal Amniotic Fluid (Objective): Vertical pocket 6cm AFI 10.0 cm (5%ile= 7.9 cm, 95%= 24.9 cm for 35 wks) FETAL BIOMETRY BPD:  8.4cm 33w 5d HC:    30.5cm 34w 0d AC:   30.1cm 34w 0d FL:   6.0cm 31w 1d Current Mean GA: 33w 2d [place] EDC: 10/25/2019 Assigned GA: 35w 0d Assigned EDC: 10/13/2019 Estimated Fetal Weight:  2,142g 25.5%ile FETAL ANATOMY Lateral Ventricles: Not visualized Thalami/CSP: Appears normal Posterior Fossa: Not visualized Nuchal Region: Not visualized    NFT= N/A > 20 WKS Upper Lip: Not visualized Spine: Appears normal 4 Chamber Heart on Left: Appears normal LVOT: Not visualized RVOT: Not visualized Stomach on Left: Appears normal 3 Vessel Cord: Appears normal Cord Insertion site: Not visualized Kidneys: Appears normal Bladder: Appears normal Extremities: Appears normal Sex: Not Visualized Technical Limitations: Late gestational age and fetal position Maternal Findings: Cervix:  Not evaluated BIOPHYSICAL PROFILE Movement: 2 time: 30 minutes Breathing: 2 Tone:  2 Amniotic Fluid: 2 Total Score:  8 IMPRESSION: 1. Single live intrauterine pregnancy as above, estimated age 19 weeks and 2 days. Estimated fetal weight at the twenty-fifth percentile. With 2. Limited fetal anatomic survey due to late gestational age. Please see discussion above. 3. Biophysical profile 8/8. Biophysical profile score is  out of 8. Electronically Signed   By: Sharlet Salina M.D.   On: 09/08/2019 22:25   US FETAL BPP W/NONSTRESS  Result Date: 09/08/2019 CLINICAL DATA:  Decreased fetal movement, uterine size and date discrepancy EXAM: OBSTETRICAL ULTRASOUND >14 WKS AND BIOPHYSICAL PROFILE FINDINGS:  Number of Fetuses: 1 Heart Rate:  147 bpm Movement: Yes Presentation: Cephalic Previa: Not evaluated on this exam Placental Location: Posterior Amniotic Fluid (Subjective): Normal Amniotic Fluid (Objective): Vertical pocket 6cm AFI 10.0 cm (5%ile= 7.9 cm, 95%= 24.9 cm for 35 wks) FETAL BIOMETRY BPD:  8.4cm 33w 5d HC:    30.5cm 34w 0d AC:   30.1cm 34w 0d FL:   6.0cm 31w 1d Current Mean GA: 33w 2d [place] EDC: 10/25/2019 Assigned GA: 35w 0d Assigned EDC: 10/13/2019 Estimated Fetal Weight:  2,142g 25.5%ile FETAL ANATOMY Lateral Ventricles: Not visualized Thalami/CSP: Appears normal Posterior Fossa: Not visualized Nuchal Region: Not visualized    NFT= N/A > 20 WKS Upper Lip: Not visualized Spine: Appears normal 4 Chamber Heart on Left: Appears normal LVOT: Not visualized RVOT: Not visualized Stomach on Left: Appears normal 3 Vessel Cord: Appears normal Cord Insertion site: Not visualized Kidneys: Appears normal Bladder: Appears normal Extremities: Appears normal Sex: Not Visualized Technical Limitations:  Late gestational age and fetal position Maternal Findings: Cervix:  Not evaluated BIOPHYSICAL PROFILE Movement: 2 time: 30 minutes Breathing: 2 Tone:  2 Amniotic Fluid: 2 Total Score:  8 IMPRESSION: 1. Single live intrauterine pregnancy as above, estimated age 44 weeks and 2 days. Estimated fetal weight at the twenty-fifth percentile. With 2. Limited fetal anatomic survey due to late gestational age. Please see discussion above. 3. Biophysical profile 8/8. Biophysical profile score is  out of 8. Electronically Signed   By: Sharlet Salina M.D.   On: 09/08/2019 22:25    Constitutional: NAD, AAOx3  HE/ENT: extraocular movements grossly intact, moist mucous membranes CV: RRR PULM: nl respiratory effort, CTABL     Abd: gravid, non-tender, non-distended, soft      Ext: Non-tender, Nonedmeatous   Psych: mood appropriate, speech normal Pelvic : moderate amount of physiologic discharge, no vaginal bleeding SVE: Dilation:  Fingertip Effacement (%): 50 Presentation: Undeterminable Exam by:: ATami Lin, CNM   Fetal Monitor: Baseline: 140 bpm, Variability: moderate, Accelerations: present and Decelerations: Absent  Toco: irregular mild contractions  Category: 1   Assessment: 24 y.o. [redacted]w[redacted]d here for antenatal surveillance during pregnancy.  Principle diagnosis: abdominal pain in pregnancy, lower back pain in pregnancy   Plan:  Labor: not present.   Fetal Wellbeing: Reassuring Cat 1 tracing.  Reactive NST, BPP 8/8  [place] for growth today shows EFW 2142 (25%), AFI 10cm (5%)  Will consult MFM for growth and evaluation for S<D and substance use in pregnancy  Reviewed comfort measures for lower back pain   May take tylenol OTC for pain   Try warm showers/baths   May use maternity support belt   D/c home stable, precautions reviewed, follow-up as scheduled.   ----- Margaretmary Eddy, CNM Certified Nurse Midwife [facility]

## 2019-09-13 ENCOUNTER — Other Ambulatory Visit: Payer: Self-pay

## 2019-09-13 ENCOUNTER — Encounter: Payer: Self-pay | Admitting: Obstetrics and Gynecology

## 2019-09-13 ENCOUNTER — Observation Stay
Admission: EM | Admit: 2019-09-13 | Discharge: 2019-09-13 | Disposition: A | Discharge status: Home / Self Care | Payer: Medicaid Other | Attending: Obstetrics and Gynecology | Admitting: Obstetrics and Gynecology

## 2019-09-13 DIAGNOSIS — N898 Other specified noninflammatory disorders of vagina: Secondary | ICD-10-CM | POA: Diagnosis present

## 2019-09-13 DIAGNOSIS — O34523 Maternal care for prolapse of gravid uterus, third trimester: Principal | ICD-10-CM | POA: Insufficient documentation

## 2019-09-13 DIAGNOSIS — Z3A35 35 weeks gestation of pregnancy: Secondary | ICD-10-CM | POA: Insufficient documentation

## 2019-09-13 NOTE — Discharge Instructions (Signed)
Pelvic Organ Prolapse Pelvic organ prolapse is the stretching, bulging, or dropping of pelvic organs into an abnormal position. It happens when the muscles and tissues that surround and support pelvic structures become weak or stretched. Pelvic organ prolapse can involve the:  Vagina (vaginal prolapse).  Uterus (uterine prolapse).  Bladder (cystocele).  Rectum (rectocele).  Intestines (enterocele). When organs other than the vagina are involved, they often bulge into the vagina or protrude from the vagina, depending on how severe the prolapse is. What are the causes? This condition may be caused by:  Pregnancy, labor, and childbirth.  Past pelvic surgery.  Decreased production of the hormone estrogen associated with menopause.  Consistently lifting more than 50 lb (23 kg).  Obesity.  Long-term inability to pass stool (chronic constipation).  A cough that lasts a long time (chronic).  Buildup of fluid in the abdomen due to certain diseases and other conditions. What are the signs or symptoms? Symptoms of this condition include:  Passing a little urine (loss of bladder control) when you cough, sneeze, strain, and exercise (stress incontinence). This may be worse immediately after childbirth. It may gradually improve over time.  Feeling pressure in your pelvis or vagina. This pressure may increase when you cough or when you are passing stool.  A bulge that protrudes from the opening of your vagina.  Difficulty passing urine or stool.  Pain in your lower back.  Pain, discomfort, or disinterest in sex.  Repeated bladder infections (urinary tract infections).  Difficulty inserting a tampon. In some people, this condition causes no symptoms. How is this diagnosed? This condition may be diagnosed based on a vaginal and rectal exam. During the exam, you may be asked to cough and strain while you are lying down, sitting, and standing up. Your health care provider will  determine if other tests are required, such as bladder function tests. How is this treated? Treatment for this condition may depend on your symptoms. Treatment may include:  Lifestyle changes, such as changes to your diet.  Emptying your bladder at scheduled times (bladder training therapy). This can help reduce or avoid urinary incontinence.  Estrogen. Estrogen may help mild prolapse by increasing the strength and tone of pelvic floor muscles.  Kegel exercises. These may help mild cases of prolapse by strengthening and tightening the muscles of the pelvic floor.  A soft, flexible device that helps support the vaginal walls and keep pelvic organs in place (pessary). This is inserted into your vagina by your health care provider.  Surgery. This is often the only form of treatment for severe prolapse. Follow these instructions at home:  Avoid drinking beverages that contain caffeine or alcohol.  Increase your intake of high-fiber foods. This can help decrease constipation and straining during bowel movements.  Lose weight if recommended by your health care provider.  Wear a sanitary pad or adult diapers if you have urinary incontinence.  Avoid heavy lifting and straining with exercise and work. Do not hold your breath when you perform mild to moderate lifting and exercise activities. Limit your activities as directed by your health care provider.  Do Kegel exercises as directed by your health care provider. To do this: ? Squeeze your pelvic floor muscles tight. You should feel a tight lift in your rectal area and a tightness in your vaginal area. Keep your stomach, buttocks, and legs relaxed. ? Hold the muscles tight for up to 10 seconds. ? Relax your muscles. ? Repeat this exercise 50 times a day,   or as many times as told by your health care provider. Continue to do this exercise for at least 4-6 weeks, or for as long as told by your health care provider.  Take over-the-counter and  prescription medicines only as told by your health care provider.  If you have a pessary, take care of it as told by your health care provider.  Keep all follow-up visits as told by your health care provider. This is important. Contact a health care provider if you:  Have symptoms that interfere with your daily activities or sex life.  Need medicine to help with the discomfort.  Notice bleeding from your vagina that is not related to your period.  Have a fever.  Have pain or bleeding when you urinate.  Have bleeding when you pass stool.  Pass urine when you have sex.  Have chronic constipation.  Have a pessary that falls out.  Have bad smelling vaginal discharge.  Have an unusual, low pain in your abdomen. Summary  Pelvic organ prolapse is the stretching, bulging, or dropping of pelvic organs into an abnormal position. It happens when the muscles and tissues that surround and support pelvic structures become weak or stretched.  When organs other than the vagina are involved, they often bulge into the vagina or protrude from the vagina, depending on how severe the prolapse is.  In most cases, this condition needs to be treated only if it produces symptoms. Treatment may include lifestyle changes, estrogen, Kegel exercises, pessary insertion, or surgery.  Avoid heavy lifting and straining with exercise and work. Do not hold your breath when you perform mild to moderate lifting and exercise activities. Limit your activities as directed by your health care provider. This information is not intended to replace advice given to you by your health care provider. Make sure you discuss any questions you have with your health care provider. Document Revised: 01/25/2017 Document Reviewed: 01/25/2017 Elsevier Patient Education  2020 [employer]  

## 2019-09-13 NOTE — OB Triage Note (Signed)
Pt is a 23y/o G1P0 at [redacted]w[redacted]d with c/o something bulging from her vagina.  Pt states its feel like its goes resolves and comes back. Pt started to notice it at 2347. Pt states +FM. Pt denies LOF, CTX and VB. Monitors applied and assessing. Initial FHT .

## 2019-09-13 NOTE — Discharge Summary (Signed)
Patient ID: Maureen Cox MRN: [id number] DOB/AGE: 10-Aug-1995 24 y.o.  Admit date: 09/13/2019 Discharge date: 09/13/2019  Admission Diagnoses: "Something bulging from her vagina" at [redacted]w[redacted]d  Discharge Diagnoses: Probably cystocele  Prenatal Procedures: none  Consults: None  Significant Diagnostic Studies:  Results for orders placed or performed during the hospital encounter of 09/08/19 (from the past 168 hour(s))  Urinalysis, Complete w Microscopic   Collection Time: 09/08/19  6:04 PM  Result Value Ref Range   Color, Urine YELLOW (A) YELLOW   APPearance CLOUDY (A) CLEAR   Specific Gravity, Urine 1.003 (L) 1.005 - 1.030   pH 6.0 5.0 - 8.0   Glucose, UA NEGATIVE NEGATIVE mg/dL   Hgb urine dipstick LARGE (A) NEGATIVE   Bilirubin Urine NEGATIVE NEGATIVE   Ketones, ur NEGATIVE NEGATIVE mg/dL   Protein, ur NEGATIVE NEGATIVE mg/dL   Nitrite NEGATIVE NEGATIVE   Leukocytes,Ua LARGE (A) NEGATIVE   RBC / HPF 0-5 0 - 5 RBC/hpf   WBC, UA >50 (H) 0 - 5 WBC/hpf   Bacteria, UA MANY (A) NONE SEEN   Squamous Epithelial / LPF 0-5 0 - 5   WBC Clumps PRESENT    Mucus PRESENT   Urine Drug Screen, Qualitative ([facility] only)   Collection Time: 09/08/19  6:04 PM  Result Value Ref Range   Tricyclic, Ur Screen NONE DETECTED NONE DETECTED   Amphetamines, Ur Screen NONE DETECTED NONE DETECTED   MDMA (Ecstasy)Ur Screen NONE DETECTED NONE DETECTED   Cocaine Metabolite,Ur [place] NONE DETECTED NONE DETECTED   Opiate, Ur Screen NONE DETECTED NONE DETECTED   Phencyclidine (PCP) Ur S NONE DETECTED NONE DETECTED   Cannabinoid 50 Ng, Ur [place] NONE DETECTED NONE DETECTED   Barbiturates, Ur Screen NONE DETECTED NONE DETECTED   Benzodiazepine, Ur Scrn NONE DETECTED NONE DETECTED   Methadone Scn, Ur NONE DETECTED NONE DETECTED  Wet prep, genital   Collection Time: 09/08/19  7:44 PM   Specimen: Cervix  Result Value Ref Range   Yeast Wet Prep HPF POC NONE SEEN NONE SEEN   Trich, Wet Prep NONE SEEN NONE SEEN   Clue  Cells Wet Prep HPF POC NONE SEEN NONE SEEN   WBC, Wet Prep HPF POC FEW (A) NONE SEEN   Sperm NONE SEEN   Chlamydia/NGC rt PCR ([facility] only)   Collection Time: 09/08/19  7:44 PM   Specimen: Cervix; Genital  Result Value Ref Range   Specimen source GC/Chlam URINE, RANDOM    Chlamydia Tr NOT DETECTED NOT DETECTED   N gonorrhoeae NOT DETECTED NOT DETECTED    Treatments: none  Hospital Course:  This is a 24 y.o. G1P0 with IUP at [redacted]w[redacted]d brought in by EMS with complaints that something was bulging from her vagina.  Reports GFM and denies VB, UCs, or LOF.  Pelvic exam revealed a 4-5 cm bulge just behind the pubic bone.  She was observed, fetal heart rate monitoring remained reassuring, and she had no signs/symptoms of preterm labor or other maternal-fetal concerns.  She was deemed stable for discharge to home with outpatient follow up.  Discharge Physical Exam:  BP 123/68 (BP Location: Right Arm)   Pulse 71   Temp 98.4 F (36.9 C) (Oral)   Resp 16   Ht 5\' 9"  (1.753 m)   Wt 103.4 kg   LMP 01/09/2019   BMI 33.67 kg/m   General: NAD CV: RRR Pulm: nl effort ABD: s/nd/nt, gravid DVT Evaluation: LE non-ttp, no evidence of DVT on exam. Pelvic exam: normal external  genitalia, vulva, vagina, cervix, uterus and adnexa,  VULVA: normal appearing vulva with no masses, tenderness or lesions,  VAGINA: PELVIC FLOOR EXAM: vaginal prolapse noted just behind the pubic bone about 4-5 cm round CERVIX: normal appearing cervix without discharge or lesions,  UTERUS: uterus is normal size, shape, consistency and nontender,  ADNEXA: normal adnexa in size, nontender and no masses.   NST: FHR baseline: 120 bpm Variability: moderate Accelerations: yes Decelerations: none Time: 30 minutes Category/reactivity: reactive  TOCO: quiet SVE: deferred      Discharge Condition: Stable  Disposition:  Discharge disposition: 01-Home or Self Care        Allergies as of 09/13/2019   No Known Allergies      Medication List    TAKE these medications   acetaminophen 500 MG tablet Commonly known as: TYLENOL Take 2 tablets (1,000 mg total) by mouth every 6 (six) hours.   prenatal multivitamin Tabs tablet Take 1 tablet by mouth daily at 12 noon.       Follow-up Information    [facility] Follow up.   Contact information: [address] [place] [address] [phone]              Signed:  Quillian Quince 09/13/2019 3:23 AM

## 2019-09-16 DIAGNOSIS — Z720 Tobacco use: Secondary | ICD-10-CM | POA: Insufficient documentation

## 2019-09-16 LAB — OB RESULTS CONSOLE GBS: GBS: NEGATIVE

## 2019-10-07 ENCOUNTER — Other Ambulatory Visit: Payer: Self-pay

## 2019-10-07 ENCOUNTER — Encounter: Payer: Self-pay | Admitting: Obstetrics and Gynecology

## 2019-10-07 ENCOUNTER — Inpatient Hospital Stay
Admission: EM | Admit: 2019-10-07 | Discharge: 2019-10-10 | DRG: 787 | Disposition: A | Discharge status: Home / Self Care | Payer: Medicaid Other

## 2019-10-07 ENCOUNTER — Observation Stay: Payer: Medicaid Other

## 2019-10-07 DIAGNOSIS — O339 Maternal care for disproportion, unspecified: Secondary | ICD-10-CM | POA: Diagnosis present

## 2019-10-07 DIAGNOSIS — O99214 Obesity complicating childbirth: Secondary | ICD-10-CM | POA: Diagnosis present

## 2019-10-07 DIAGNOSIS — O99334 Smoking (tobacco) complicating childbirth: Secondary | ICD-10-CM | POA: Diagnosis present

## 2019-10-07 DIAGNOSIS — D62 Acute posthemorrhagic anemia: Secondary | ICD-10-CM | POA: Diagnosis not present

## 2019-10-07 DIAGNOSIS — Z20822 Contact with and (suspected) exposure to covid-19: Secondary | ICD-10-CM | POA: Diagnosis present

## 2019-10-07 DIAGNOSIS — O99324 Drug use complicating childbirth: Secondary | ICD-10-CM | POA: Diagnosis present

## 2019-10-07 DIAGNOSIS — D573 Sickle-cell trait: Secondary | ICD-10-CM | POA: Diagnosis present

## 2019-10-07 DIAGNOSIS — O368130 Decreased fetal movements, third trimester, not applicable or unspecified: Principal | ICD-10-CM | POA: Diagnosis present

## 2019-10-07 DIAGNOSIS — F17210 Nicotine dependence, cigarettes, uncomplicated: Secondary | ICD-10-CM | POA: Diagnosis present

## 2019-10-07 DIAGNOSIS — F1290 Cannabis use, unspecified, uncomplicated: Secondary | ICD-10-CM | POA: Diagnosis present

## 2019-10-07 DIAGNOSIS — Z3A39 39 weeks gestation of pregnancy: Secondary | ICD-10-CM | POA: Diagnosis not present

## 2019-10-07 DIAGNOSIS — O9081 Anemia of the puerperium: Secondary | ICD-10-CM | POA: Diagnosis not present

## 2019-10-07 LAB — URINE DRUG SCREEN, QUALITATIVE (ARMC ONLY)
Amphetamines, Ur Screen: NOT DETECTED
Barbiturates, Ur Screen: NOT DETECTED
Benzodiazepine, Ur Scrn: NOT DETECTED
Cannabinoid 50 Ng, Ur ~~LOC~~: NOT DETECTED
Cocaine Metabolite,Ur ~~LOC~~: NOT DETECTED
MDMA (Ecstasy)Ur Screen: NOT DETECTED
Methadone Scn, Ur: NOT DETECTED
Opiate, Ur Screen: NOT DETECTED
Phencyclidine (PCP) Ur S: NOT DETECTED
Tricyclic, Ur Screen: NOT DETECTED

## 2019-10-07 LAB — CBC
HCT: 35.3 % — ABNORMAL LOW (ref 36.0–46.0)
Hemoglobin: 12.1 g/dL (ref 12.0–15.0)
MCH: 28.9 pg (ref 26.0–34.0)
MCHC: 34.3 g/dL (ref 30.0–36.0)
MCV: 84.4 fL (ref 80.0–100.0)
Platelets: 401 10*3/uL — ABNORMAL HIGH (ref 150–400)
RBC: 4.18 MIL/uL (ref 3.87–5.11)
RDW: 13.2 % (ref 11.5–15.5)
WBC: 6.5 10*3/uL (ref 4.0–10.5)
nRBC: 0 % (ref 0.0–0.2)

## 2019-10-07 LAB — TYPE AND SCREEN
ABO/RH(D): O POS
Antibody Screen: NEGATIVE

## 2019-10-07 LAB — SARS CORONAVIRUS 2 BY RT PCR (HOSPITAL ORDER, PERFORMED IN ~~LOC~~ HOSPITAL LAB): SARS Coronavirus 2: NEGATIVE

## 2019-10-07 LAB — ABO/RH: ABO/RH(D): O POS

## 2019-10-07 MED ORDER — ACETAMINOPHEN 500 MG PO TABS
1000.0000 mg | ORAL_TABLET | Freq: Four times a day (QID) | ORAL | Status: DC | PRN
  Administered 2019-10-08: 1000 mg via ORAL
  Filled 2019-10-07: qty 2

## 2019-10-07 MED ORDER — BUTORPHANOL TARTRATE 1 MG/ML IJ SOLN: 1.0000 mg | INTRAMUSCULAR | Status: DC | PRN

## 2019-10-07 MED ORDER — OXYTOCIN BOLUS FROM INFUSION: 333.0000 mL | Freq: Once | INTRAVENOUS | Status: DC

## 2019-10-07 MED ORDER — TERBUTALINE SULFATE 1 MG/ML IJ SOLN
0.2500 mg | Freq: Once | INTRAMUSCULAR | Status: AC | PRN
  Administered 2019-10-08: 0.25 mg via SUBCUTANEOUS
  Filled 2019-10-07: qty 1

## 2019-10-07 MED ORDER — OXYTOCIN-SODIUM CHLORIDE 30-0.9 UT/500ML-% IV SOLN: 2.5000 [IU]/h | INTRAVENOUS | Status: DC

## 2019-10-07 MED ORDER — LIDOCAINE HCL (PF) 1 % IJ SOLN: 30.0000 mL | INTRAMUSCULAR | Status: DC | PRN

## 2019-10-07 MED ORDER — MISOPROSTOL 25 MCG QUARTER TABLET
25.0000 ug | ORAL_TABLET | ORAL | Status: DC | PRN
  Administered 2019-10-07 – 2019-10-08 (×2): 25 ug via VAGINAL
  Filled 2019-10-07: qty 1

## 2019-10-07 MED ORDER — MISOPROSTOL 25 MCG QUARTER TABLET
25.0000 ug | ORAL_TABLET | ORAL | Status: DC | PRN
  Filled 2019-10-07: qty 1

## 2019-10-07 MED ORDER — ONDANSETRON HCL 4 MG/2ML IJ SOLN
4.0000 mg | Freq: Four times a day (QID) | INTRAMUSCULAR | Status: DC | PRN
  Administered 2019-10-08: 4 mg via INTRAVENOUS
  Filled 2019-10-07: qty 2

## 2019-10-07 MED ORDER — LACTATED RINGERS IV SOLN: INTRAVENOUS | Status: DC

## 2019-10-07 MED ORDER — SOD CITRATE-CITRIC ACID 500-334 MG/5ML PO SOLN
30.0000 mL | ORAL | Status: DC | PRN
  Administered 2019-10-08: 30 mL via ORAL
  Filled 2019-10-07: qty 15

## 2019-10-07 MED ORDER — LACTATED RINGERS IV SOLN: 500.0000 mL | INTRAVENOUS | Status: DC | PRN

## 2019-10-07 MED ORDER — OXYTOCIN-SODIUM CHLORIDE 30-0.9 UT/500ML-% IV SOLN
1.0000 m[IU]/min | INTRAVENOUS | Status: DC
  Administered 2019-10-08: 2 m[IU]/min via INTRAVENOUS
  Filled 2019-10-07: qty 500

## 2019-10-07 NOTE — H&P (Signed)
OB History & Physical   History of Present Illness:  Chief Complaint:   HPI:  Maureen Cox is a 24 y.o. G1P0 female at [redacted]w[redacted]d dated by LMP of 01/06/2019, c/w [place] at [redacted]w[redacted]d.  She presents to L&D for decreased fetal movement  Reports decreased fetal movement for at least a day Contractions: denies  LOF/SROM: denies  Vaginal bleeding: denies   Pregnancy Issues: 1. Obesity in pregnancy  2. History of substance use - opiates, THC, cocaine a. Reports only THC use since knowledge of pregnancy  3. Hx of PTSD and depression - stopped taking zoloft  4. Sickle cell trait  5. Tobacco use in pregnancy  6. Needs pap smear postpartum   Patient Active Problem List   Diagnosis Date Noted  . Decreased fetal movement affecting management of pregnancy in third trimester 10/07/2019  . Vaginal mass 09/13/2019  . Pelvic pain in pregnancy 09/08/2019  . Abdominal pain during pregnancy, second trimester 06/19/2019  . Morbid obesity (HCC) 210 lbs 11/12/2018  . PTSD (post-traumatic stress disorder) 2020 11/12/2018  . Smoker 1/2-1.5 ppd 11/12/2018  . Marijuana abuse 11/12/2018     Maternal Medical History:   Past Medical History:  Diagnosis Date  . Depression   . PTSD (post-traumatic stress disorder)     Past Surgical History:  Procedure Laterality Date  . NO PAST SURGERIES      No Known Allergies  Prior to Admission medications   Medication Sig Start Date End Date Taking? Authorizing Provider  acetaminophen (TYLENOL) 500 MG tablet Take 2 tablets (1,000 mg total) by mouth every 6 (six) hours. 06/20/19  Yes McVey, Prudencio Pair, CNM  Prenatal Vit-Fe Fumarate-FA (PRENATAL MULTIVITAMIN) TABS tablet Take 1 tablet by mouth daily at 12 noon.   Yes [provider]     [facility]  Social History: She  reports that she has been smoking cigarettes. She has been smoking about 0.50 packs per day. She has never used smokeless tobacco. She reports previous alcohol use of  about 12.0 standard drinks of alcohol per week. She reports previous drug use. Drug: Marijuana.  Family History: family history is not on file.   Review of Systems: A full review of systems was performed and negative except as noted in the HPI.     Physical Exam:  Vital Signs: BP 124/77   Pulse 89   Temp 98.7 F (37.1 C) (Oral)   Resp 20   Ht 5\' 9"  (1.753 m)   Wt 104.3 kg   LMP 01/09/2019   BMI 33.97 kg/m  Physical Exam  General: no acute distress.  HEENT: normocephalic, atraumatic Heart: regular rate & rhythm.  No murmurs/rubs/gallops Lungs: clear to auscultation bilaterally, normal respiratory effort Abdomen: soft, gravid, non-tender;  EFW: 2460g on 09/17/2019 Pelvic:   External: Normal external female genitalia  Cervix: Dilation: Fingertip / Effacement (%): 50 / Station: -2    Extremities: non-tender, symmetric, no edema bilaterally.  DTRs: 2+/2+  Neurologic: Alert & oriented x 3.    No results found for this or any previous visit (from the past 24 hour(s)).  Pertinent Results:  Prenatal Labs: Blood type/Rh O positive   Antibody screen neg  Rubella Immune  Varicella Pending  RPR NR  HBsAg Neg  HIV NR  GC neg  Chlamydia neg  Genetic screening negative  1 hour GTT 115  3 hour GTT N/A  GBS Negative    FHT: Baseline: 130 bpm, Variability: moderate, Accelerations: present and Decelerations: Absent  TOCO: occasional mild contractions  SVE:  Dilation: Fingertip / Effacement (%): 50 / Station: -2    Cephalic by ultrasound   US OB Comp + 14 Wk  Result Date: 09/08/2019 CLINICAL DATA:  Decreased fetal movement, uterine size and date discrepancy EXAM: OBSTETRICAL ULTRASOUND >14 WKS AND BIOPHYSICAL PROFILE FINDINGS: Number of Fetuses: 1 Heart Rate:  147 bpm Movement: Yes Presentation: Cephalic Previa: Not evaluated on this exam Placental Location: Posterior Amniotic Fluid (Subjective): Normal Amniotic Fluid (Objective): Vertical pocket 6cm AFI 10.0 cm (5%ile= 7.9 cm,  95%= 24.9 cm for 35 wks) FETAL BIOMETRY BPD:  8.4cm 33w 5d HC:    30.5cm 34w 0d AC:   30.1cm 34w 0d FL:   6.0cm 31w 1d Current Mean GA: 33w 2d [place] EDC: 10/25/2019 Assigned GA: 35w 0d Assigned EDC: 10/13/2019 Estimated Fetal Weight:  2,142g 25.5%ile FETAL ANATOMY Lateral Ventricles: Not visualized Thalami/CSP: Appears normal Posterior Fossa: Not visualized Nuchal Region: Not visualized    NFT= N/A > 20 WKS Upper Lip: Not visualized Spine: Appears normal 4 Chamber Heart on Left: Appears normal LVOT: Not visualized RVOT: Not visualized Stomach on Left: Appears normal 3 Vessel Cord: Appears normal Cord Insertion site: Not visualized Kidneys: Appears normal Bladder: Appears normal Extremities: Appears normal Sex: Not Visualized Technical Limitations: Late gestational age and fetal position Maternal Findings: Cervix:  Not evaluated BIOPHYSICAL PROFILE Movement: 2 time: 30 minutes Breathing: 2 Tone:  2 Amniotic Fluid: 2 Total Score:  8 IMPRESSION: 1. Single live intrauterine pregnancy as above, estimated age 25 weeks and 2 days. Estimated fetal weight at the twenty-fifth percentile. With 2. Limited fetal anatomic survey due to late gestational age. Please see discussion above. 3. Biophysical profile 8/8. Biophysical profile score is  out of 8. Electronically Signed   By: Sharlet Salina M.D.   On: 09/08/2019 22:25   US OB Limited  Result Date: 10/07/2019 CLINICAL DATA:  Decreased fetal movement. EXAM: LIMITED OBSTETRIC ULTRASOUND AND BIOPHYSICAL PROFILE FINDINGS: Heart Rate:  141 bpm Movement: Yes Presentation: Cephalic Placental Location: Posterior Previa: No Amniotic Fluid (Subjective): Within normal limits. AFI: 21.7 cm BPD: 7.3 cm 37 w  3 d MATERNAL FINDINGS: Cervix:  Appears closed. Uterus/Adnexae: A 4.3 cm x 2.4 cm x 5.1 cm complex hypoechoic and anechoic structure is seen within the soft tissue structures of the vagina. No flow is seen within this region on color Doppler evaluation. Movement:  2  Time: Breathing:  0 Tone:  2 Amniotic Fluid: 2 Total Score:  6 IMPRESSION: Single, viable intrauterine pregnancy at approximately 37 weeks and 3 days gestation by ultrasound evaluation. Biophysical profile score is 6 out of 8. This exam is performed on an emergent basis and does not comprehensively evaluate fetal size, dating, or anatomy; follow-up complete OB US should be considered if further fetal assessment is warranted. Electronically Signed   By: Aram Candela M.D.   On: 10/07/2019 16:31   US Fetal BPP W/O Non Stress  Result Date: 10/07/2019 CLINICAL DATA:  Decreased fetal movement. EXAM: BIOPHYSICAL PROFILE FINDINGS: Number of Fetuses: 1 Heart Rate: 141 bpm Presentation: Cephalic Movement: 2 Breathing: 0 Tone: 2 Amniotic Fluid: 2 Total Score: 6 IMPRESSION: Single, viable intrauterine pregnancy with a biophysical profile score of 6/8. Electronically Signed   By: Aram Candela M.D.   On: 10/07/2019 16:42   US FETAL BPP W/NONSTRESS  Result Date: 09/08/2019 CLINICAL DATA:  Decreased fetal movement, uterine size and date discrepancy EXAM: OBSTETRICAL ULTRASOUND >14 WKS AND BIOPHYSICAL PROFILE FINDINGS: Number of Fetuses:  1 Heart Rate:  147 bpm Movement: Yes Presentation: Cephalic Previa: Not evaluated on this exam Placental Location: Posterior Amniotic Fluid (Subjective): Normal Amniotic Fluid (Objective): Vertical pocket 6cm AFI 10.0 cm (5%ile= 7.9 cm, 95%= 24.9 cm for 35 wks) FETAL BIOMETRY BPD:  8.4cm 33w 5d HC:    30.5cm 34w 0d AC:   30.1cm 34w 0d FL:   6.0cm 31w 1d Current Mean GA: 33w 2d [place] EDC: 10/25/2019 Assigned GA: 35w 0d Assigned EDC: 10/13/2019 Estimated Fetal Weight:  2,142g 25.5%ile FETAL ANATOMY Lateral Ventricles: Not visualized Thalami/CSP: Appears normal Posterior Fossa: Not visualized Nuchal Region: Not visualized    NFT= N/A > 20 WKS Upper Lip: Not visualized Spine: Appears normal 4 Chamber Heart on Left: Appears normal LVOT: Not visualized RVOT: Not visualized Stomach on Left: Appears normal 3  Vessel Cord: Appears normal Cord Insertion site: Not visualized Kidneys: Appears normal Bladder: Appears normal Extremities: Appears normal Sex: Not Visualized Technical Limitations: Late gestational age and fetal position Maternal Findings: Cervix:  Not evaluated BIOPHYSICAL PROFILE Movement: 2 time: 30 minutes Breathing: 2 Tone:  2 Amniotic Fluid: 2 Total Score:  8 IMPRESSION: 1. Single live intrauterine pregnancy as above, estimated age 67 weeks and 2 days. Estimated fetal weight at the twenty-fifth percentile. With 2. Limited fetal anatomic survey due to late gestational age. Please see discussion above. 3. Biophysical profile 8/8. Biophysical profile score is  out of 8. Electronically Signed   By: Sharlet Salina M.D.   On: 09/08/2019 22:25    Assessment:  ALYANA KREITER is a 24 y.o. G1P0 female at [redacted]w[redacted]d with IOL for decreased fetal movement at term.   Plan:  1. Admit to Labor & Delivery; consents reviewed and obtained - Covid admission screen  - Dr. Feliberto Gottron updated on admission and plan of care   2. Fetal Well being  - Fetal Tracing: cat 1 - BPP 6/8 - Group B Streptococcus ppx not indicated: GBS neg - Presentation: cephalic confirmed by ultrasound   3. Routine OB: - Prenatal labs reviewed, as above - Rh pos - CBC, T&S, RPR on admit - Clear fluids, IVF  4. Induction of labor  -  Contractions monitored with external toco -  Pelvis dequate for trial of labor  -  Plan for induction with misoprostol  -  Plan for  continuous fetal monitoring -  Maternal pain control as desired - Anticipate vaginal delivery  5. Post Partum Planning: - Infant feeding: TBD - Contraception: TBD - Tdap vaccine: given 07/29/2019 - Flu vaccine: due for current season   Gustavo Lah, Ina Homes 10/07/19 5:52 PM  Margaretmary Eddy, CNM Certified Nurse Midwife [facility]

## 2019-10-07 NOTE — OB Triage Note (Signed)
Pt. Presents to L/D triage for reported decreased fetal movement. She has not felt her baby move since yesterday morning, despite rest and juice. She reports no bleeding or LOF. She reports constant back pain, rated 9/10, that began last night. Pt texting. VSS. Monitors applied and assessing.

## 2019-10-08 ENCOUNTER — Encounter: Payer: Self-pay | Admitting: Obstetrics and Gynecology

## 2019-10-08 ENCOUNTER — Inpatient Hospital Stay: Payer: Medicaid Other | Admitting: Anesthesiology

## 2019-10-08 ENCOUNTER — Encounter: Admission: EM | Disposition: A | Discharge status: Home / Self Care | Payer: Self-pay | Source: Home / Self Care

## 2019-10-08 LAB — CBC
HCT: 32.7 % — ABNORMAL LOW (ref 36.0–46.0)
Hemoglobin: 11 g/dL — ABNORMAL LOW (ref 12.0–15.0)
MCH: 28.6 pg (ref 26.0–34.0)
MCHC: 33.6 g/dL (ref 30.0–36.0)
MCV: 84.9 fL (ref 80.0–100.0)
Platelets: 349 10*3/uL (ref 150–400)
RBC: 3.85 MIL/uL — ABNORMAL LOW (ref 3.87–5.11)
RDW: 13.1 % (ref 11.5–15.5)
WBC: 11.4 10*3/uL — ABNORMAL HIGH (ref 4.0–10.5)
nRBC: 0 % (ref 0.0–0.2)

## 2019-10-08 LAB — CREATININE, SERUM
Creatinine, Ser: 0.65 mg/dL (ref 0.44–1.00)
GFR calc Af Amer: >60 mL/min (ref >60)
GFR calc non Af Amer: >60 mL/min (ref >60)

## 2019-10-08 LAB — RPR: RPR Ser Ql: NONREACTIVE

## 2019-10-08 SURGERY — Surgical Case
Anesthesia: Epidural

## 2019-10-08 MED ORDER — BUPIVACAINE HCL (PF) 0.25 % IJ SOLN
INTRAMUSCULAR | Status: DC | PRN
  Administered 2019-10-08 (×2): 4 mL via EPIDURAL

## 2019-10-08 MED ORDER — LACTATED RINGERS IV SOLN: 500.0000 mL | Freq: Once | INTRAVENOUS | Status: DC

## 2019-10-08 MED ORDER — GABAPENTIN 300 MG PO CAPS
300.0000 mg | ORAL_CAPSULE | Freq: Every day | ORAL | Status: DC
  Administered 2019-10-08 – 2019-10-09 (×2): 300 mg via ORAL
  Filled 2019-10-08 (×2): qty 1

## 2019-10-08 MED ORDER — MORPHINE SULFATE (PF) 0.5 MG/ML IJ SOLN
INTRAMUSCULAR | Status: AC
  Filled 2019-10-08: qty 10

## 2019-10-08 MED ORDER — MENTHOL 3 MG MT LOZG
1.0000 | LOZENGE | OROMUCOSAL | Status: DC | PRN
  Filled 2019-10-08: qty 9

## 2019-10-08 MED ORDER — MORPHINE SULFATE (PF) 0.5 MG/ML IJ SOLN
INTRAMUSCULAR | Status: DC | PRN
Start: 2019-10-08 — End: 2019-10-08
  Administered 2019-10-08: 3 mg via EPIDURAL

## 2019-10-08 MED ORDER — PHENYLEPHRINE 40 MCG/ML (10ML) SYRINGE FOR IV PUSH (FOR BLOOD PRESSURE SUPPORT): 80.0000 ug | PREFILLED_SYRINGE | INTRAVENOUS | Status: DC | PRN

## 2019-10-08 MED ORDER — SIMETHICONE 80 MG PO CHEW
80.0000 mg | CHEWABLE_TABLET | ORAL | Status: DC | PRN
  Administered 2019-10-09 – 2019-10-10 (×2): 80 mg via ORAL
  Filled 2019-10-08 (×2): qty 1

## 2019-10-08 MED ORDER — KETOROLAC TROMETHAMINE 30 MG/ML IJ SOLN
30.0000 mg | Freq: Four times a day (QID) | INTRAMUSCULAR | Status: AC
  Administered 2019-10-09 (×3): 30 mg via INTRAVENOUS
  Filled 2019-10-08 (×4): qty 1

## 2019-10-08 MED ORDER — SIMETHICONE 80 MG PO CHEW
80.0000 mg | CHEWABLE_TABLET | ORAL | Status: DC
  Filled 2019-10-08 (×2): qty 1

## 2019-10-08 MED ORDER — IBUPROFEN 800 MG PO TABS
800.0000 mg | ORAL_TABLET | Freq: Four times a day (QID) | ORAL | Status: DC
  Administered 2019-10-09 – 2019-10-10 (×3): 800 mg via ORAL
  Filled 2019-10-08 (×3): qty 1

## 2019-10-08 MED ORDER — SODIUM CHLORIDE (PF) 0.9 % IJ SOLN
INTRAMUSCULAR | Status: AC
  Filled 2019-10-08: qty 50

## 2019-10-08 MED ORDER — SIMETHICONE 80 MG PO CHEW
80.0000 mg | CHEWABLE_TABLET | Freq: Three times a day (TID) | ORAL | Status: DC
  Administered 2019-10-09 – 2019-10-10 (×4): 80 mg via ORAL
  Filled 2019-10-08 (×2): qty 1

## 2019-10-08 MED ORDER — BUPIVACAINE LIPOSOME 1.3 % IJ SUSP
INTRAMUSCULAR | Status: AC
  Filled 2019-10-08: qty 20

## 2019-10-08 MED ORDER — FENTANYL 2.5 MCG/ML W/ROPIVACAINE 0.15% IN NS 100 ML EPIDURAL (ARMC)
EPIDURAL | Status: AC
  Filled 2019-10-08: qty 100

## 2019-10-08 MED ORDER — OXYTOCIN-SODIUM CHLORIDE 30-0.9 UT/500ML-% IV SOLN
INTRAVENOUS | Status: AC
  Administered 2019-10-09: 2.5 [IU]/h via INTRAVENOUS
  Filled 2019-10-08: qty 500

## 2019-10-08 MED ORDER — DIPHENHYDRAMINE HCL 50 MG/ML IJ SOLN: 12.5000 mg | INTRAMUSCULAR | Status: DC | PRN

## 2019-10-08 MED ORDER — WITCH HAZEL-GLYCERIN EX PADS: 1.0000 "application " | MEDICATED_PAD | CUTANEOUS | Status: DC | PRN

## 2019-10-08 MED ORDER — PHENYLEPHRINE HCL (PRESSORS) 10 MG/ML IV SOLN
INTRAVENOUS | Status: DC | PRN
  Administered 2019-10-08: 50 ug via INTRAVENOUS

## 2019-10-08 MED ORDER — LIDOCAINE HCL (PF) 1 % IJ SOLN
INTRAMUSCULAR | Status: DC | PRN
  Administered 2019-10-08: 3 mL

## 2019-10-08 MED ORDER — LIDOCAINE HCL (PF) 2 % IJ SOLN
INTRAMUSCULAR | Status: DC | PRN
  Administered 2019-10-08 (×3): 100 mg via EPIDURAL

## 2019-10-08 MED ORDER — OXYCODONE HCL 5 MG PO TABS
5.0000 mg | ORAL_TABLET | ORAL | Status: DC | PRN
  Administered 2019-10-08 – 2019-10-10 (×9): 10 mg via ORAL
  Filled 2019-10-08 (×9): qty 2

## 2019-10-08 MED ORDER — BUPIVACAINE LIPOSOME 1.3 % IJ SUSP
INTRAMUSCULAR | Status: DC | PRN
  Administered 2019-10-08: 60 mL

## 2019-10-08 MED ORDER — EPHEDRINE 5 MG/ML INJ
10.0000 mg | INTRAVENOUS | Status: DC | PRN
  Administered 2019-10-08: 10 mg via INTRAVENOUS
  Filled 2019-10-08: qty 4

## 2019-10-08 MED ORDER — DEXTROSE 5 % IV SOLN
3.0000 g | Freq: Once | INTRAVENOUS | Status: AC
  Administered 2019-10-08: 3 g via INTRAVENOUS
  Filled 2019-10-08: qty 3

## 2019-10-08 MED ORDER — ONDANSETRON HCL 4 MG/2ML IJ SOLN
INTRAMUSCULAR | Status: DC | PRN
  Administered 2019-10-08: 4 mg via INTRAVENOUS

## 2019-10-08 MED ORDER — ENOXAPARIN SODIUM 40 MG/0.4ML ~~LOC~~ SOLN
40.0000 mg | SUBCUTANEOUS | Status: DC
  Administered 2019-10-09: 40 mg via SUBCUTANEOUS
  Filled 2019-10-08: qty 0.4

## 2019-10-08 MED ORDER — TETANUS-DIPHTH-ACELL PERTUSSIS 5-2.5-18.5 LF-MCG/0.5 IM SUSP
0.5000 mL | Freq: Once | INTRAMUSCULAR | Status: DC
  Filled 2019-10-08: qty 0.5

## 2019-10-08 MED ORDER — HEMOSTATIC AGENTS (NO CHARGE) OPTIME
TOPICAL | Status: DC | PRN
  Administered 2019-10-08: 1

## 2019-10-08 MED ORDER — LACTATED RINGERS IV SOLN: INTRAVENOUS | Status: DC

## 2019-10-08 MED ORDER — COCONUT OIL OIL: 1.0000 "application " | TOPICAL_OIL | Status: DC | PRN

## 2019-10-08 MED ORDER — ACETAMINOPHEN 500 MG PO TABS
1000.0000 mg | ORAL_TABLET | Freq: Four times a day (QID) | ORAL | Status: DC
  Administered 2019-10-08 – 2019-10-10 (×6): 1000 mg via ORAL
  Filled 2019-10-08 (×7): qty 2

## 2019-10-08 MED ORDER — OXYTOCIN-SODIUM CHLORIDE 30-0.9 UT/500ML-% IV SOLN
INTRAVENOUS | Status: DC | PRN
  Administered 2019-10-08: 250 mL via INTRAVENOUS

## 2019-10-08 MED ORDER — LIDOCAINE 2% (20 MG/ML) 5 ML SYRINGE: INTRAMUSCULAR | Status: DC | PRN

## 2019-10-08 MED ORDER — SENNOSIDES-DOCUSATE SODIUM 8.6-50 MG PO TABS
2.0000 | ORAL_TABLET | ORAL | Status: DC
  Administered 2019-10-09 – 2019-10-10 (×2): 2 via ORAL
  Filled 2019-10-08 (×2): qty 2

## 2019-10-08 MED ORDER — PRENATAL MULTIVITAMIN CH
1.0000 | ORAL_TABLET | Freq: Every day | ORAL | Status: DC
  Administered 2019-10-09: 1 via ORAL
  Filled 2019-10-08: qty 1

## 2019-10-08 MED ORDER — MORPHINE SULFATE (PF) 2 MG/ML IV SOLN
1.0000 mg | Freq: Once | INTRAVENOUS | Status: AC
  Administered 2019-10-08: 2 mg via INTRAVENOUS
  Filled 2019-10-08: qty 1

## 2019-10-08 MED ORDER — BUPIVACAINE HCL (PF) 0.5 % IJ SOLN
INTRAMUSCULAR | Status: AC
  Filled 2019-10-08: qty 30

## 2019-10-08 MED ORDER — DIBUCAINE (PERIANAL) 1 % EX OINT: 1.0000 "application " | TOPICAL_OINTMENT | CUTANEOUS | Status: DC | PRN

## 2019-10-08 MED ORDER — FENTANYL 2.5 MCG/ML W/ROPIVACAINE 0.15% IN NS 100 ML EPIDURAL (ARMC)
12.0000 mL/h | EPIDURAL | Status: DC
  Administered 2019-10-08 (×2): 12 mL/h via EPIDURAL
  Filled 2019-10-08: qty 100

## 2019-10-08 MED ORDER — LIDOCAINE-EPINEPHRINE (PF) 1.5 %-1:200000 IJ SOLN
INTRAMUSCULAR | Status: DC | PRN
  Administered 2019-10-08: 3 mL via PERINEURAL

## 2019-10-08 MED ORDER — ACETAMINOPHEN 500 MG PO TABS
ORAL_TABLET | ORAL | Status: AC
  Filled 2019-10-08: qty 2

## 2019-10-08 MED ORDER — DIPHENHYDRAMINE HCL 25 MG PO CAPS: 25.0000 mg | ORAL_CAPSULE | Freq: Four times a day (QID) | ORAL | Status: DC | PRN

## 2019-10-08 MED ORDER — EPHEDRINE 5 MG/ML INJ: 10.0000 mg | INTRAVENOUS | Status: DC | PRN

## 2019-10-08 MED ORDER — SODIUM CHLORIDE 0.9 % IV SOLN
500.0000 mg | INTRAVENOUS | Status: DC
  Administered 2019-10-08: 500 mg via INTRAVENOUS
  Filled 2019-10-08: qty 500

## 2019-10-08 MED ORDER — KETOROLAC TROMETHAMINE 30 MG/ML IJ SOLN
INTRAMUSCULAR | Status: AC
  Filled 2019-10-08: qty 1

## 2019-10-08 MED ORDER — OXYTOCIN-SODIUM CHLORIDE 30-0.9 UT/500ML-% IV SOLN: 2.5000 [IU]/h | INTRAVENOUS | Status: AC

## 2019-10-08 MED ORDER — KETOROLAC TROMETHAMINE 30 MG/ML IJ SOLN
INTRAMUSCULAR | Status: AC
  Administered 2019-10-08: 30 mg
  Filled 2019-10-08: qty 1

## 2019-10-08 MED ORDER — ZOLPIDEM TARTRATE 5 MG PO TABS: 5.0000 mg | ORAL_TABLET | Freq: Every evening | ORAL | Status: DC | PRN

## 2019-10-08 MED ORDER — KETOROLAC TROMETHAMINE 30 MG/ML IJ SOLN
INTRAMUSCULAR | Status: DC | PRN
  Administered 2019-10-08: 30 mg via INTRAVENOUS

## 2019-10-08 SURGICAL SUPPLY — 27 items
BARRIER ADHS 3X4 INTERCEED (GAUZE/BANDAGES/DRESSINGS) ×4 IMPLANT
CANISTER SUCT 3000ML PPV (MISCELLANEOUS) ×2 IMPLANT
CHLORAPREP W/TINT 26 (MISCELLANEOUS) ×2 IMPLANT
COVER WAND RF STERILE (DRAPES) ×2 IMPLANT
DRSG TELFA 3X8 NADH (GAUZE/BANDAGES/DRESSINGS) ×2 IMPLANT
ELECT CAUTERY BLADE 6.4 (BLADE) ×2 IMPLANT
ELECT REM PT RETURN 9FT ADLT (ELECTROSURGICAL) ×2
ELECTRODE REM PT RTRN 9FT ADLT (ELECTROSURGICAL) ×1 IMPLANT
GAUZE SPONGE 4X4 12PLY STRL (GAUZE/BANDAGES/DRESSINGS) ×2 IMPLANT
GAUZE SPONGE 4X4 3PLY NS LF (GAUZE/BANDAGES/DRESSINGS) ×2 IMPLANT
GLOVE SURG SYN 8.0 (GLOVE) ×2 IMPLANT
GOWN STRL REUS W/ TWL LRG LVL3 (GOWN DISPOSABLE) ×2 IMPLANT
GOWN STRL REUS W/ TWL XL LVL3 (GOWN DISPOSABLE) ×1 IMPLANT
GOWN STRL REUS W/TWL LRG LVL3 (GOWN DISPOSABLE) ×2
GOWN STRL REUS W/TWL XL LVL3 (GOWN DISPOSABLE) ×1
NEEDLE HYPO 22GX1.5 SAFETY (NEEDLE) ×2 IMPLANT
NS IRRIG 1000ML POUR BTL (IV SOLUTION) ×2 IMPLANT
PACK C SECTION (MISCELLANEOUS) ×2 IMPLANT
PAD OB MATERNITY 4.3X12.25 (PERSONAL CARE ITEMS) ×2 IMPLANT
PAD PREP 24X41 OB/GYN DISP (PERSONAL CARE ITEMS) ×2 IMPLANT
STAPLER INSORB 30 2030 C-SECTI (MISCELLANEOUS) ×2 IMPLANT
STRAP SAFETY 5IN WIDE (MISCELLANEOUS) ×2 IMPLANT
SUT CHROMIC 1 CTX 36 (SUTURE) ×8 IMPLANT
SUT PLAIN GUT 0 (SUTURE) ×6 IMPLANT
SUT VIC AB 0 CT1 36 (SUTURE) ×4 IMPLANT
SYR 30ML LL (SYRINGE) ×4 IMPLANT
TAPE PAPER 3X10 WHT MICROPORE (GAUZE/BANDAGES/DRESSINGS) ×2 IMPLANT

## 2019-10-08 NOTE — Progress Notes (Signed)
Labor Progress Note  Maureen Cox is a 24 y.o. G1P0 at 104w2d by LMP admitted for induction of labor due to decreased fetal movement at term, preg complicated by hx substance and tobacco abuse, PTSD/depression, sickle cell trait and obesity.   Subjective: comfortable with epidural  Objective: BP (!) 108/54   Pulse 63   Temp 98 F (36.7 C) (Oral)   Resp 18   Ht 5\' 9"  (1.753 m)   Wt 104.3 kg   LMP 01/09/2019   SpO2 96%   BMI 33.97 kg/m  Notable VS details: reviewed  Fetal Assessment: FHT:  FHR: 140 bpm, variability: moderate,  accelerations:  Abscent,  decelerations:  Present early and variable decels.  Category/reactivity:  Category II UC:   regular, every 1-3 minutes SVE: approx 4cm soft fluctuant mass noted in anterior vagina, previous eval suggested possible cystocele.  SVE 4/C/-1, BBOW, AROM performed, large amount clear fluid noted. Small amt bloody show.   Membrane status: AROM at 0825 Amniotic color: Clear  Labs: Lab Results  Component Value Date   WBC 6.5 10/07/2019   HGB 12.1 10/07/2019   HCT 35.3 (L) 10/07/2019   MCV 84.4 10/07/2019   PLT 401 (H) 10/07/2019    Assessment / Plan: IOL for DFM at term, G1 at 39.2wks  Labor: Progressing after 2 doses of cytotec.  Preeclampsia:  no e/o pre-e, normotensive.  Fetal Wellbeing:  Category II Pain Control:  Epidural I/D:  n/a Anticipated MOD:  NSVD  10/09/2019 Samiksha Pellicano, CNM 10/08/2019, 10:05 AM

## 2019-10-08 NOTE — Discharge Summary (Signed)
Obstetrical Discharge Summary  Patient Name: Maureen Cox DOB: [birth year] MRN: [id number]  Date of Admission: 10/07/2019 Date of Delivery: 10/08/19 Delivered by: Beverly Gust MD Date of Discharge: 10/10/2019  [facility]: [facility] DXA:JOINOMV'E last menstrual period was 01/09/2019. EDC Estimated Date of Delivery: 10/13/19 Gestational Age at Delivery: [redacted]w[redacted]d   Antepartum complications: 1. Obesity in pregnancy  2. History of substance use - opiates, THC, cocaine a. Reports only THC use since knowledge of pregnancy  3. Hx of PTSD and depression - stopped taking zoloft  4. Sickle cell trait  5. Tobacco use in pregnancy  6. Needs pap smear postpartum   Admitting Diagnosis: decreased fetal movement, 39wks Secondary Diagnosis: fetal intolerance of labor, primary LTCS  Patient Active Problem List   Diagnosis Date Noted  . Decreased fetal movement affecting management of pregnancy in third trimester 10/07/2019  . Vaginal mass 09/13/2019  . Pelvic pain in pregnancy 09/08/2019  . Abdominal pain during pregnancy, second trimester 06/19/2019  . Morbid obesity (HCC) 210 lbs 11/12/2018  . PTSD (post-traumatic stress disorder) 2020 11/12/2018  . Smoker 1/2-1.5 ppd 11/12/2018  . Marijuana abuse 11/12/2018    Augmentation: AROM, Pitocin and Cytotec Complications: None Intrapartum complications/course: IOL for DFM at term, hx substance/tobacco abuse and limited PNC received at [facility]. Fetal intolerance of labor.  Cephalopelvic disproportion  Documented at time of surgery  Date of Delivery: 10/08/19 Delivered By: Dr Feliberto Gottron Delivery Type: primary cesarean section, low transverse incision Anesthesia: epidural Placenta: manual Laceration:none Episiotomy: none Newborn Data: Live born female "Raniyah Rose" Birth Weight:  6#5 APGAR: , 8/9 birth 35 on 10/08/19  Newborn Delivery   Birth date/time:  Delivery type: C-Section, Low Transverse C-section categorization: Primary       Postpartum Procedures: none  Edinburgh:  Edinburgh Postnatal Depression Scale Screening Tool 10/09/2019 10/09/2019  I have been able to laugh and see the funny side of things. 0 (No Data)  I have looked forward with enjoyment to things. 0 -  I have blamed myself unnecessarily when things went wrong. 1 -  I have been anxious or worried for no good reason. 0 -  I have felt scared or panicky for no good reason. 0 -  Things have been getting on top of me. 0 -  I have been so unhappy that I have had difficulty sleeping. 0 -  I have felt sad or miserable. 0 -  I have been so unhappy that I have been crying. 0 -  The thought of harming myself has occurred to me. 0 -  Edinburgh Postnatal Depression Scale Total 1 -      Discharge Physical Exam:  BP 125/80 (BP Location: Right Arm)   Pulse 80   Temp 98.4 F (36.9 C) (Oral)   Resp 18   Ht 5\' 9"  (1.753 m)   Wt 104.3 kg   LMP 01/09/2019   SpO2 98%   Breastfeeding Unknown   BMI 33.97 kg/m   General: NAD CV: RRR Pulm: CTABL, nl effort ABD: s/nd/nt, fundus firm and below the umbilicus Lochia: moderate Incision: c/d/i, healing well, no significant drainage, no dehiscence, no significant erythema DVT Evaluation: LE non-ttp, no evidence of DVT on exam.  Hemoglobin  Date Value Ref Range Status  10/09/2019 9.8 (L) 12.0 - 15.0 g/dL Final   HCT  Date Value Ref Range Status  10/09/2019 28.8 (L) 36 - 46 % Final     Disposition: stable, discharge to home. Baby Feeding: formula Baby Disposition: home with mom  Rh Immune globulin given: n/a Rubella vaccine given: immune Varicella vaccine given: immune Tdap vaccine given in AP or PP setting: 07/29/19 Flu vaccine given in AP or PP setting:offer prior to DC  Contraception: Depo given at DC  Prenatal Labs:  Blood type/Rh O positive   Antibody screen neg  Rubella Immune  Varicella Pending  RPR NR  HBsAg Neg  HIV NR  GC neg  Chlamydia neg  Genetic screening negative  1 hour GTT  115  3 hour GTT N/A  GBS Negative       Plan:  BAHAR SHELDEN was discharged to home in good condition. Follow-up appointment with delivering provider in 2 weeks.  Discharge Medications: Allergies as of 10/10/2019   No Known Allergies     Medication List    TAKE these medications   acetaminophen 500 MG tablet Commonly known as: TYLENOL Take 2 tablets (1,000 mg total) by mouth every 6 (six) hours.   ferrous sulfate 325 (65 FE) MG tablet Take 1 tablet (325 mg total) by mouth 2 (two) times daily with a meal.   ibuprofen 800 MG tablet Commonly known as: ADVIL Take 1 tablet (800 mg total) by mouth every 6 (six) hours.   oxyCODONE 5 MG immediate release tablet Commonly known as: Oxy IR/ROXICODONE Take 1 tablet (5 mg total) by mouth every 6 (six) hours as needed for up to 7 days for moderate pain.   senna-docusate 8.6-50 MG tablet Commonly known as: Senokot-S Take 2 tablets by mouth daily.   simethicone 80 MG chewable tablet Commonly known as: MYLICON Chew 1 tablet (80 mg total) by mouth 3 (three) times daily after meals.        Follow-up Information    Schermerhorn, Ihor Austin, MD Follow up in 2 week(s).   Specialty: Obstetrics and Gynecology Why: post-op visit Contact information: [address] [place] [address] [phone]               Signed:  Randa Ngo, CNM 10/10/2019  8:51 AM

## 2019-10-08 NOTE — Anesthesia Procedure Notes (Signed)
Epidural Patient location during procedure: OB Start time: 10/08/2019 7:32 AM End time: 10/08/2019 7:40 AM  Staffing Anesthesiologist: Piscitello, Cleda Mccreedy, MD Resident/CRNA: Irving Burton, CRNA Performed: resident/CRNA   Preanesthetic Checklist Completed: patient identified, IV checked, site marked, risks and benefits discussed, surgical consent, monitors and equipment checked, pre-op evaluation and timeout performed  Epidural Patient position: sitting Prep: ChloraPrep Patient monitoring: heart rate, continuous pulse ox and blood pressure Approach: midline Location: L3-L4 Injection technique: LOR saline  Needle:  Needle type: Tuohy  Needle gauge: 17 G Needle length: 9 cm and 9 Needle insertion depth: 7 cm Catheter type: closed end flexible Catheter size: 19 Gauge Catheter at skin depth: 12 cm Test dose: negative and 1.5% lidocaine with Epi 1:200 K  Assessment Sensory level: T10 Events: blood not aspirated, injection not painful, no injection resistance, no paresthesia and negative IV test  Additional Notes 1 attempt Pt. Evaluated and documentation done after procedure finished. Patient identified. Risks/Benefits/Options discussed with patient including but not limited to bleeding, infection, nerve damage, paralysis, failed block, incomplete pain control, headache, blood pressure changes, nausea, vomiting, reactions to medication both or allergic, itching and postpartum back pain. Confirmed with bedside nurse the patient's most recent platelet count. Confirmed with patient that they are not currently taking any anticoagulation, have any bleeding history or any family history of bleeding disorders. Patient expressed understanding and wished to proceed. All questions were answered. Sterile technique was used throughout the entire procedure. Please see nursing notes for vital signs. Test dose was given through epidural catheter and negative prior to continuing to dose epidural or  start infusion. Warning signs of high block given to the patient including shortness of breath, tingling/numbness in hands, complete motor block, or any concerning symptoms with instructions to call for help. Patient was given instructions on fall risk and not to get out of bed. All questions and concerns addressed with instructions to call with any issues or inadequate analgesia.   Patient tolerated the insertion well without immediate complications.Reason for block:procedure for pain

## 2019-10-08 NOTE — Progress Notes (Signed)
Patient ID: Maureen Cox, female   DOB: [birth year], 24 y.o.   MRN: [id number] CNM notified me of fetal monitoring .  Currently variable decels. Cat 2 strip. IUPC and FSe placed .4 cm  Recommend terbutaline and continue close monitoring .

## 2019-10-08 NOTE — Progress Notes (Signed)
Recurrent variable decles . No cx charge 6 cm / c/ 0 with caput formation .  Fetal intolerance to labor with early caput formation .  Spoke to the pt about proceeding to c/s . I have counseled her about the risks . All questions answered . Proceed

## 2019-10-08 NOTE — Anesthesia Preprocedure Evaluation (Signed)
Anesthesia Evaluation   Patient awake    Reviewed: Allergy & Precautions, H&P , NPO status , Patient's Chart, lab work & pertinent test results  Airway Mallampati: II   Neck ROM: full    Dental no notable dental hx.    Pulmonary Current Smoker and Patient abstained from smoking.,    Pulmonary exam normal        Cardiovascular negative cardio ROS Normal cardiovascular exam     Neuro/Psych Anxiety Depression negative neurological ROS     GI/Hepatic negative GI ROS, Neg liver ROS,   Endo/Other  negative endocrine ROS  Renal/GU negative Renal ROS  negative genitourinary   Musculoskeletal   Abdominal   Peds  Hematology negative hematology ROS (+)   Anesthesia Other Findings   Reproductive/Obstetrics (+) Pregnancy                             Anesthesia Physical Anesthesia Plan  ASA: II  Anesthesia Plan: Epidural   Post-op Pain Management:    Induction:   PONV Risk Score and Plan:   Airway Management Planned:   Additional Equipment:   Intra-op Plan:   Post-operative Plan:   Informed Consent: I have reviewed the patients History and Physical, chart, labs and discussed the procedure including the risks, benefits and alternatives for the proposed anesthesia with the patient or authorized representative who has indicated his/her understanding and acceptance.     Dental Advisory Given  Plan Discussed with: CRNA and Anesthesiologist  Anesthesia Plan Comments:         Anesthesia Quick Evaluation

## 2019-10-08 NOTE — Transfer of Care (Signed)
Immediate Anesthesia Transfer of Care Note  Patient: Maureen Cox  Procedure(s) Performed: CESAREAN SECTION  Patient Location: L&D  Anesthesia Type:Epidural  Level of Consciousness: awake, alert  and oriented  Airway & Oxygen Therapy: Patient Spontanous Breathing  Post-op Assessment: Post -op Vital signs reviewed and stable  Post vital signs: stable  Last Vitals:  Vitals Value Taken Time  BP 101/85   Temp    Pulse 89   Resp 12   SpO2 99     Last Pain:  Vitals:   10/08/19 1445  TempSrc: Oral  PainSc:       Patients Stated Pain Goal: 0 (10/07/19 1409)  Complications: No complications documented.

## 2019-10-08 NOTE — Brief Op Note (Signed)
10/08/2019  6:08 PM  PATIENT:  Maureen Cox  24 y.o. female  PRE-OPERATIVE DIAGNOSIS:  cesarean section- fetal intolerance/failure to progress  POST-OPERATIVE DIAGNOSIS:  cesarean section- fetal intolerance/failure to progress  cephalopelvic disproportion   PROCEDURE:  Procedure(s): CESAREAN SECTION Low transverse cesarean section  SURGEON:  Surgeon(s) and Role:    * Aydon Swamy, Ihor Austin, MD - Primary  PHYSICIAN ASSISTANT: Heloise Ochoa , CNM   ASSISTANTS: none   ANESTHESIA:   epidural  EBL:  725 cc QBL  IOF 700 cc OU 40cc  BLOOD ADMINISTERED:none  DRAINS: Urinary Catheter (Foley)   LOCAL MEDICATIONS USED:  MARCAINE    and BUPIVICAINE   SPECIMEN:  No Specimen  DISPOSITION OF SPECIMEN:  PATHOLOGY  COUNTS:  YES  TOURNIQUET:  * No tourniquets in log *  DICTATION: .Other Dictation: Dictation Number verbal  PLAN OF CARE: Admit to inpatient   PATIENT DISPOSITION:  PACU - hemodynamically stable.   Delay start of Pharmacological VTE agent (>24hrs) due to surgical blood loss or risk of bleeding: not applicable

## 2019-10-08 NOTE — Progress Notes (Signed)
Labor Progress Note  Maureen Cox is a 24 y.o. G1P0 at [redacted]w[redacted]d by LMP admitted for induction of labor due to decreased fetal movement at term, preg complicated by hx substance and tobacco abuse, PTSD/depression, sickle cell trait and obesity.   Subjective: comfortable with epidural  Objective: BP 111/60    Pulse (!) 109    Temp 98 F (36.7 C) (Oral)    Resp 18    Ht 5\' 9"  (1.753 m)    Wt 104.3 kg    LMP 01/09/2019    SpO2 91%    BMI 33.97 kg/m  Notable VS details: reviewed  Fetal Assessment: FHT:  FHR: 130 bpm, variability: moderate,  accelerations:  Abscent,  decelerations:  Present early and variable decels.  Category/reactivity:  Category II UC:   regular, every 1-3 minutes, Pitocin at 8mu/min SVE: approx 4cm soft fluctuant mass noted in anterior vagina- unchanged.  Cx 6/C/-1 with caput.   Membrane status: AROM at 0825 Amniotic color: Clear  Labs: Lab Results  Component Value Date   WBC 6.5 10/07/2019   HGB 12.1 10/07/2019   HCT 35.3 (L) 10/07/2019   MCV 84.4 10/07/2019   PLT 401 (H) 10/07/2019    Assessment / Plan: IOL for DFM at term, G1 at 39.2wks  Labor: s/p 2 doses of cytotec, pitocin then off due to decels and terb given around 1330; resumed pitocin after Cat I tracing again. Cervical change since last exam but no fetal descent and now cat II tracing again with inadequate MVUs.  Preeclampsia:  no e/o pre-e, normotensive.  Fetal Wellbeing:  Category II- Dr 10/09/2019 notified of tracing, reviewed.  Pain Control:  Epidural I/D:  n/a   Feliberto Gottron, CNM 10/08/2019, 4:01 PM

## 2019-10-09 ENCOUNTER — Encounter: Payer: Self-pay | Admitting: Obstetrics and Gynecology

## 2019-10-09 LAB — CBC
HCT: 28.8 % — ABNORMAL LOW (ref 36.0–46.0)
Hemoglobin: 9.8 g/dL — ABNORMAL LOW (ref 12.0–15.0)
MCH: 29.1 pg (ref 26.0–34.0)
MCHC: 34 g/dL (ref 30.0–36.0)
MCV: 85.5 fL (ref 80.0–100.0)
Platelets: 295 10*3/uL (ref 150–400)
RBC: 3.37 MIL/uL — ABNORMAL LOW (ref 3.87–5.11)
RDW: 13.1 % (ref 11.5–15.5)
WBC: 12.7 10*3/uL — ABNORMAL HIGH (ref 4.0–10.5)
nRBC: 0 % (ref 0.0–0.2)

## 2019-10-09 LAB — VARICELLA ZOSTER ANTIBODY, IGG: Varicella IgG: 234 index (ref >165)

## 2019-10-09 MED ORDER — FERROUS SULFATE 325 (65 FE) MG PO TABS
325.0000 mg | ORAL_TABLET | Freq: Two times a day (BID) | ORAL | Status: DC
  Administered 2019-10-09 – 2019-10-10 (×3): 325 mg via ORAL
  Filled 2019-10-09 (×3): qty 1

## 2019-10-09 NOTE — Progress Notes (Signed)
Post Partum Day 1  Subjective: up ad lib, voiding, tolerating PO and + flatus  Doing well, no concerns. Ambulating without difficulty, pain managed with PO meds, tolerating regular diet, and voiding without difficulty.   No fever/chills, chest pain, shortness of breath, nausea/vomiting, or leg pain. No nipple or breast pain.   Objective: BP (!) 102/56 (BP Location: Right Arm)   Pulse 89   Temp 98.4 F (36.9 C) (Oral)   Resp 20   Ht 5\' 9"  (1.753 m)   Wt 104.3 kg   LMP 01/09/2019   SpO2 97%   Breastfeeding Unknown   BMI 33.97 kg/m    Physical Exam:  General: alert and cooperative Breasts: soft/nontender CV: RRR Pulm: nl effort Abdomen: soft, non-tender Uterine Fundus: firm Incision: dressing c/d/i Perineum: intact Lochia: appropriate DVT Evaluation: No evidence of DVT seen on physical exam. Edinburgh: No flowsheet data found.   Recent Labs    10/08/19 1858 10/09/19 0353  HGB 11.0* 9.8*  HCT 32.7* 28.8*  WBC 11.4* 12.7*  PLT 349 295    Assessment/Plan: 24 y.o. G1P1001 postpartum day # 1  -Continue routine postpartum care -Acute blood loss anemia - hemodynamically stable and asymptomatic; start PO ferrous sulfate BID with stool softeners  -Immunization status: Needs flu prior to discharge  Disposition: Continue inpatient postpartum care    LOS: 2 days   Geddy Boydstun, CNM 10/09/2019, 8:42 AM

## 2019-10-09 NOTE — Op Note (Signed)
Maureen Cox, HITTLE MEDICAL RECORD [id number] ACCOUNT [id number] DATE OF BIRTH:[birth year] FACILITY: [facility] LOCATION: [facility] PHYSICIAN:Ronnae Kaser Cloyde Reams, MD  OPERATIVE REPORT  DATE OF PROCEDURE:  10/08/2019  PREOPERATIVE DIAGNOSES:   1.  39+2 weeks estimated gestational age. 2.  Fetal intolerance to labor.  POSTOPERATIVE DIAGNOSES: 1.  39+2 weeks estimated gestational age. 2.  Fetal intolerance to labor. 3.  Cephalopelvic disproportion. 4.  Vigorous female delivered.  PROCEDURE:  Primary low transverse cesarean section.  ANESTHESIA:  Surgical dosing of continuous lumbar epidural.  SURGEON:  Jennell Corner, MD  FIRST ASSISTANT:  Heloise Ochoa, certified nurse midwife  INDICATIONS:  A 24 year old gravida 1, para 0 patient at 39+1 weeks when she was admitted to labor and delivery for decreased fetal movement.  The patient has a history of drug use during pregnancy.  Induction of labor was commenced the day before the  procedure.  Patient the day of the procedure did not progress past 6 cm and fetal monitoring demonstrated repetitive variable decelerations and a cervical exam documented cervix to be stagnant at 6 cm with caput of the fetal head noted.  DESCRIPTION OF PROCEDURE:  After adequate surgical dosing of continuous lumbar epidural, the patient was placed in dorsal supine position, hip roll on the right side.  The patient received 3 grams IV Ancef and 500 mg azithromycin for surgical  prophylaxis.  Timeout was performed.  Pfannenstiel incision was made 2 fingerbreadths above the symphysis pubis.  Sharp dissection was used to identify the fascia that was scored in the midline and opened in a transverse fashion.  Superior aspect of the  fascia was grasped with Kocher clamps and the recti muscles were dissected free.  Inferior aspect of the fascia was grasped with Kocher clamps and the pyramidalis muscle was dissected free.  Entry into the peritoneal cavity was  accomplished sharply.   Vesicular peritoneal fold was identified and opened and the bladder was reflected inferiorly.  Low transverse uterine incision was made.  Upon entry into the endometrial cavity, clear fluid resulted.  The incision was extended with blunt transverse  traction.  An extremely wedged fetal head, asynclitic was elevated with some degree of effort and ultimately the head was brought to the incision and with fundal pressure, the head, shoulders and body were delivered without difficulty.  A vigorous female  was then dried on the abdomen for 60 seconds and then passed to nursery staff who assigned Apgar scores of 8 and 9.  Time of birth 66 on 10/08/2019.  The placenta was manually delivered and the uterus was exteriorized.  Endometrial cavity was wiped  clean with laparotomy tape.  Uterine incision was then closed with a running 1 chromic in a running locking fashion.  Two additional figure-of-eight sutures were required for good hemostasis.  Fallopian tubes and ovaries appeared normal.  Posterior  cul-de-sac was then irrigated and suctioned and the uterus was placed back into the abdominal cavity.  The pericolic gutters were wiped clean with laparotomy tape.  Uterine incision again appeared hemostatic and Interceed was placed over the uterine  incision in a T-shaped fashion.  Fascia was then closed with 0 Vicryl suture in a running nonlocking fashion; 2 separate sutures were used.  The subfascial edges were then injected with a solution of 20 mL of 1.3% Exparel plus 30 mL of 0.5% Marcaine plus  50 mL normal saline; 30 mL of the solution was injected in the fascial edges.  Subcutaneous tissues were irrigated and bovied for hemostasis.  Given the depth of the subcutaneous tissue of 2.5 cm the subcutaneous tissues were closed with a 2-0 chromic  suture.  Skin was reapproximated with Insorb absorbable staples.  Good cosmetic effect.  The patient tolerated the procedure well.  ESTIMATED BLOOD  LOSS:  725 mL.  QUANTITATIVE INTRAOPERATIVE FLUIDS:  700 mL.  URINE OUTPUT:  400 mL.  DISPOSITION:  The patient tolerated the procedure well and was taken to recovery room in good condition.  CN/NUANCE  D:10/08/2019 T:10/09/2019 JOB:[id number]

## 2019-10-09 NOTE — Anesthesia Postprocedure Evaluation (Signed)
Anesthesia Post Note  Patient: Maureen Cox  Procedure(s) Performed: CESAREAN SECTION  Patient location during evaluation: Mother Baby Anesthesia Type: Epidural Level of consciousness: awake and alert Pain management: pain level controlled Vital Signs Assessment: post-procedure vital signs reviewed and stable Respiratory status: spontaneous breathing, nonlabored ventilation and respiratory function stable Cardiovascular status: stable Postop Assessment: no headache, no backache and epidural receding Anesthetic complications: no   No complications documented.   Last Vitals:  Vitals:   10/09/19 0500 10/09/19 0533  BP:    Pulse: 99 83  Resp:    Temp:    SpO2: 95% 96%    Last Pain:  Vitals:   10/09/19 0533  TempSrc:   PainSc: 9                  [name]

## 2019-10-09 NOTE — Anesthesia Post-op Follow-up Note (Signed)
  Anesthesia Pain Follow-up Note  Patient: Maureen Cox  Day #: 1  Date of Follow-up: 10/09/2019 Time: 8:24 AM  Last Vitals:  Vitals:   10/09/19 0500 10/09/19 0533  BP:    Pulse: 99 83  Resp:    Temp:    SpO2: 95% 96%    Level of Consciousness: alert  Pain: none   Side Effects:None  Catheter Site Exam:clean, dry, no drainage  Anti-Coag Meds (From admission, onward)   Start     Dose/Rate Route Frequency Ordered Stop   10/09/19 1700  enoxaparin (LOVENOX) injection 40 mg        40 mg Subcutaneous Every 24 hours 10/08/19 1829         Plan: D/C from anesthesia care at surgeon's request  Karoline Caldwell

## 2019-10-10 MED ORDER — OXYCODONE HCL 5 MG PO TABS
5.0000 mg | ORAL_TABLET | Freq: Four times a day (QID) | ORAL | 0 refills | Status: AC | PRN
Start: 2019-10-10 — End: 2019-10-17

## 2019-10-10 MED ORDER — INFLUENZA VAC SPLIT QUAD 0.5 ML IM SUSY: 0.5000 mL | PREFILLED_SYRINGE | INTRAMUSCULAR | Status: DC | PRN

## 2019-10-10 MED ORDER — SIMETHICONE 80 MG PO CHEW: 80.0000 mg | CHEWABLE_TABLET | Freq: Three times a day (TID) | ORAL | 0 refills | Status: DC

## 2019-10-10 MED ORDER — MEDROXYPROGESTERONE ACETATE 150 MG/ML IM SUSP
150.0000 mg | Freq: Once | INTRAMUSCULAR | Status: AC
  Administered 2019-10-10: 150 mg via INTRAMUSCULAR
  Filled 2019-10-10: qty 1

## 2019-10-10 MED ORDER — SENNOSIDES-DOCUSATE SODIUM 8.6-50 MG PO TABS: 2.0000 | ORAL_TABLET | ORAL | 0 refills | Status: DC

## 2019-10-10 MED ORDER — IBUPROFEN 800 MG PO TABS: 800.0000 mg | ORAL_TABLET | Freq: Four times a day (QID) | ORAL | 0 refills | Status: DC

## 2019-10-10 MED ORDER — FERROUS SULFATE 325 (65 FE) MG PO TABS: 325.0000 mg | ORAL_TABLET | Freq: Two times a day (BID) | ORAL | 0 refills | Status: DC

## 2019-10-10 MED ORDER — ACETAMINOPHEN 500 MG PO TABS: 1000.0000 mg | ORAL_TABLET | Freq: Four times a day (QID) | ORAL | 0 refills | Status: DC

## 2019-10-10 NOTE — Progress Notes (Addendum)
Post Partum Day 2 Subjective: Doing well, no complaints.  Tolerating regular diet, pain with PO meds, voiding and ambulating without difficulty.  No CP SOB Fever,Chills, N/V or leg pain; denies nipple or breast pain, no HA change of vision, RUQ/epigastric pain  Objective: BP 125/80 (BP Location: Right Arm)    Pulse 80    Temp 98.4 F (36.9 C) (Oral)    Resp 18    Ht 5\' 9"  (1.753 m)    Wt 104.3 kg    LMP 01/09/2019    SpO2 98%    Breastfeeding Unknown    BMI 33.97 kg/m    Physical Exam:  General: NAD Breasts: soft/nontender CV: RRR Pulm: nl effort, CTABL Abdomen: soft, NT, BS x 4 Incision: Dsg CDI, pt to remove dressing in shower today. NO drainage or erythema noted surrounding.  Lochia: small Uterine Fundus: fundus firm and 2 fb below umbilicus DVT Evaluation: no cords, ttp LEs   Recent Labs    10/08/19 1858 10/09/19 0353  HGB 11.0* 9.8*  HCT 32.7* 28.8*  WBC 11.4* 12.7*  PLT 349 295    Assessment/Plan: 24 y.o. G1P1001 postpartum day # 2  - Continue routine PP care - Lactation consult prn  - Discussed contraceptive options including implant, IUDs hormonal and non-hormonal, injection, pills/ring/patch, condoms, and NFP. Desires Depo prior to DC.  - Acute blood loss anemia - hemodynamically stable and asymptomatic; start po ferrous sulfate BID with stool softeners  - Immunization status: offer flu vaccine at DC - social work consult prior to DC.    Disposition: Does desire Dc home today.     30, CNM 10/10/2019  8:21 AM

## 2019-10-10 NOTE — Lactation Note (Signed)
This note was copied from a baby's chart. Lactation Consultation Note  Patient Name: Girl Lakia Gritton [id number] Date: 10/10/2019 Reason for consult: Initial assessment  Lactation Rounds: LC to the room, asked Mother if she is breastfeeding. Mother stated she is not BF and does not want to ever BF. LC discussed ways to dry up her milk supply. Discussed not stimulating the breast, when taking a warm shower not letting the water touch her breasts, form fitting tank tops or bras, but no underwire and ice as needed. Use of a cabbage leaves can also help engorgement. Mother stated understanding. LC gave and reviewed formula storage and prep sheet as well.   Maternal Data Formula Feeding for Exclusion: Yes Reason for exclusion: Mother's choice to formula and breast feed on admission (Mother has decided to only feed formula) Does the patient have breastfeeding experience prior to this delivery?: No  Feeding Feeding Type: Formula Nipple Type: Slow - flow  LATCH Score                   Interventions Interventions:  (discussed drying up supply, ice, cabbage, no stimulation)  Lactation Tools Discussed/Used     Consult Status Consult Status: PRN    Dereka Lueras D Dawan Farney 10/10/2019, 11:35 AM

## 2019-10-10 NOTE — Progress Notes (Signed)
Call from CPS Supervisor. She reported they screened out the report due to most recent MOB and Baby drug screens being negative and MOB being appropriate during staff interactions. She asked to be informed of Cord Screen results if they are positive for anything. CSW will monitor CDS.  Alfonso Ramus, [place] [phone]

## 2019-10-10 NOTE — Progress Notes (Signed)
Pt reviewed discharge instructions with RN. Pt verbalizes understanding and questions answered. Pt to personal vehicle via wheelchair.

## 2019-10-10 NOTE — Clinical Social Work Maternal (Signed)
CLINICAL SOCIAL WORK MATERNAL/CHILD NOTE  Patient Details  Name: Maureen Cox MRN: [id number] Date of Birth: 09-Sep-1995  Date:  10/10/2019  Clinical Social Worker Initiating Note:  Bellarose Burtt Date/Time: Initiated:  10/10/19/      Child's Name:  Maureen Cox   Biological Parents:  Father, Mother   Need for Interpreter:  None   Reason for Referral:  Current Substance Use/Substance Use During Pregnancy , Behavioral Health Concerns, Late or No Prenatal Care    Address:  [address] [place] [address]    -- MOB reported she and Roseanne Kaufman will be living at : [address] [place], [place]    Phone number:  [phone] (home)     Additional phone number: None  Household Members/Support Persons (HM/SP):   Household Member/Support Person 1, Household Member/Support Person 2   HM/SP Name Relationship DOB or Age  HM/SP -50 MOB's friend friend unknown  HM/SP -2 MOB's friend's daughter friend's daughter 57 years old  HM/SP -3        HM/SP -4        HM/SP -5        HM/SP -6        HM/SP -7        HM/SP -8          Natural Supports (not living in the home):  Extended Family, Friends, Immediate Family   Professional Supports:     Employment: Unemployed   Type of Work:     Education:  9 to 11 years (11th grade)   Homebound arranged: No  Financial Resources:  Medicaid   Other Resources:  Physicist, medical , WIC   Cultural/Religious Considerations Which May Impact Care:  None reported.  Strengths:  Ability to meet basic needs , Compliance with medical plan , Pediatrician chosen, Home prepared for child , Understanding of illness   Psychotropic Medications:         Pediatrician:    [facility]  Pediatrician List:   [facility] Other ([facility])  [facility]      Pediatrician Fax Number:    Risk Factors/Current Problems:  Substance Use    Cognitive State:   Alert , Able to Concentrate , Goal Oriented    Mood/Affect:  Calm    CSW Assessment: CSW received a consult for substance use, MH history, and limited PNC.  CSW spoke with RN Eliezer Lofts prior to meeting with MOB. Per RN, MOB is appropriate and attentive to Baby.   Per chart review, MOB had a positive drug screen during pregnancy (in June 2021). CSW was also informed by staff of note about MOB having an unintentional overdose in February. MOB's drug screens in August and September 2021 were negative. Baby's Urine Drug Screen was negative. Cord Drug Screen pending.   CSW met with MOB at bedside. Explained HIPPA and MOB elected for FOB- Loman Brooklyn- to remain at bedside during assessment. Explained CSW's role and reason for referral.  MOB reported she is feeling good, just sore post delivery. MOB was alert, appropriate, and attentive to Baby during assessment.   Confirmed contact information for MOB. MOB and Baby will be living with MOB's best friend and best friend's daughter at discharge. See [place] address listed above as that is where MOB said they will be living.    [facility] Drug Screen/CPS Report Policy. CSW called [place] DSS due to positive drug screen during pregnancy, Worker Anderson Malta reported MOB's address is in [place]. CPS Report made to [facility] following assessment with MOB, as required by policy. Caryl Never that per RN, MOB and Baby are medically ready to discharge now so would likely be at home when CPS went out if they do screen in the report. Dewaine Oats did not report and barriers to discharge, updated RN.    MOB reported she receives Physicist, medical and CSW reminded her to inform her Workers [employer] birth. MOB reported she plans to apply for [facility] as well. MOB plans to use [facility] for [facility]. CSW inquired about MOB's prenatal care as the consult said she had limited prenatal care. Unable to see notes in MOB's chart.  MOB reported she went to [facility] for [facility] care and began prenatal care at [redacted] weeks gestation. MOB reported she did not miss prenatal appointments.   MOB reported she has a bassinet, car seat (used- CSW reminded MOB to make sure it is unexpired), clothing, diapers, and all other items needed for Baby. MOB reported she has reliable transportation for herself and Baby. MOB denied resource needs at this time.   Per chart review, MOB has a history of PTSD and depression. MOB denied mental health history or current mental health concerns.. MOB reported she has a good support system and is coping well emotionally at this time. MOB denied SI, HI, or DV. MOB denied the need for mental health support resources at this time, reported she is aware of resources if needed.  CSW provided education and information sheets on PPD and SIDS. MOB verbalized understanding. CSW ecouraged MOB to reach out to her Provider with any questions or needs for support or resources, even after discharge.   MOB denied any needs or questions at this time. CSW encouraged MOB to reach out if any arise prior to discharge.   Please re consult CSW if any additional needs or concerns arise.  CSW Plan/Description:  Sudden Infant Death Syndrome (SIDS) Education, Perinatal Mood and Anxiety Disorder (PMADs) Education, Other Patient/Family Education, Hospital Drug Screen Policy Information, Child Protective Service Report , CSW Awaiting CPS Disposition Plan, CSW Will Continue to Monitor Umbilical Cord Tissue Drug Screen Results and Make Report if Warranted, No Further Intervention Required/No Barriers to Discharge    [place], LCSW 10/10/2019, 11:45 AM

## 2020-01-13 ENCOUNTER — Emergency Department
Admission: EM | Admit: 2020-01-13 | Discharge: 2020-01-13 | Disposition: A | Discharge status: Home / Self Care | Payer: Medicaid Other | Attending: Emergency Medicine | Admitting: Emergency Medicine

## 2020-01-13 ENCOUNTER — Other Ambulatory Visit: Payer: Self-pay

## 2020-01-13 DIAGNOSIS — R059 Cough, unspecified: Secondary | ICD-10-CM | POA: Diagnosis present

## 2020-01-13 DIAGNOSIS — F17210 Nicotine dependence, cigarettes, uncomplicated: Secondary | ICD-10-CM | POA: Diagnosis not present

## 2020-01-13 DIAGNOSIS — U071 COVID-19: Secondary | ICD-10-CM | POA: Diagnosis not present

## 2020-01-13 DIAGNOSIS — Z79899 Other long term (current) drug therapy: Secondary | ICD-10-CM | POA: Insufficient documentation

## 2020-01-13 LAB — RESP PANEL BY RT-PCR (FLU A&B, COVID) ARPGX2
Influenza A by PCR: NEGATIVE
Influenza B by PCR: NEGATIVE
SARS Coronavirus 2 by RT PCR: POSITIVE — AB

## 2020-01-13 MED ORDER — ONDANSETRON 4 MG PO TBDP: 4.0000 mg | ORAL_TABLET | Freq: Three times a day (TID) | ORAL | 0 refills | Status: AC | PRN

## 2020-01-13 MED ORDER — ALBUTEROL SULFATE HFA 108 (90 BASE) MCG/ACT IN AERS: 2.0000 | INHALATION_SPRAY | Freq: Four times a day (QID) | RESPIRATORY_TRACT | 2 refills | Status: DC | PRN

## 2020-01-13 NOTE — Discharge Instructions (Signed)
Continue taking Tylenol and Ibuprofen alternating for headache, bodyaches and fever.

## 2020-01-13 NOTE — ED Provider Notes (Signed)
[facility]-EMERGENCY DEPARTMENT  ____________________________________________  Time seen: Approximately 3:55 PM  I have reviewed the triage vital signs and the nursing notes.   HISTORY  Chief Complaint Cough   Historian Patient     HPI Maureen Cox is a 24 y.o. female presents to the emergency department with nonproductive cough, headache and loss of taste that started last night.  She states that she has been around someone who tested positive for COVID-19.  No chest pain, chest tightness or abdominal pain.  Patient's daughter recently tested positive for COVID-19 as well.  No emesis or diarrhea.  No rash.   Past Medical History:  Diagnosis Date  . Depression   . PTSD (post-traumatic stress disorder)      Immunizations up to date:  Yes.     Past Medical History:  Diagnosis Date  . Depression   . PTSD (post-traumatic stress disorder)     Patient Active Problem List   Diagnosis Date Noted  . Decreased fetal movement affecting management of pregnancy in third trimester 10/07/2019  . Vaginal mass 09/13/2019  . Pelvic pain in pregnancy 09/08/2019  . Abdominal pain during pregnancy, second trimester 06/19/2019  . Morbid obesity (HCC) 210 lbs 11/12/2018  . PTSD (post-traumatic stress disorder) 2020 11/12/2018  . Smoker 1/2-1.5 ppd 11/12/2018  . Marijuana abuse 11/12/2018    Past Surgical History:  Procedure Laterality Date  . CESAREAN SECTION  10/08/2019   Procedure: CESAREAN SECTION;  Surgeon: Schermerhorn, Ihor Austin, MD;  Location: [facility] ORS;  Service: Obstetrics;;  . NO PAST SURGERIES      Prior to Admission medications   Medication Sig Start Date End Date Taking? Authorizing Provider  albuterol (VENTOLIN HFA) 108 (90 Base) MCG/ACT inhaler Inhale 2 puffs into the lungs every 6 (six) hours as needed for wheezing or shortness of breath. 01/13/20  Yes Pia Mau M, PA-C  ondansetron (ZOFRAN ODT) 4 MG disintegrating tablet Take 1 tablet (4 mg total) by mouth every  8 (eight) hours as needed for up to 5 days. 01/13/20 01/18/20 Yes Pia Mau M, PA-C  acetaminophen (TYLENOL) 500 MG tablet Take 2 tablets (1,000 mg total) by mouth every 6 (six) hours. 10/10/19   McVey, Prudencio Pair, CNM  ferrous sulfate 325 (65 FE) MG tablet Take 1 tablet (325 mg total) by mouth 2 (two) times daily with a meal. 10/10/19   McVey, Prudencio Pair, CNM  ibuprofen (ADVIL) 800 MG tablet Take 1 tablet (800 mg total) by mouth every 6 (six) hours. 10/10/19   McVey, Prudencio Pair, CNM  senna-docusate (SENOKOT-S) 8.6-50 MG tablet Take 2 tablets by mouth daily. 10/10/19   McVey, Prudencio Pair, CNM  simethicone (MYLICON) 80 MG chewable tablet Chew 1 tablet (80 mg total) by mouth 3 (three) times daily after meals. 10/10/19   McVey, Prudencio Pair, CNM    Allergies Patient has no known allergies.  No family history on file.  Social History Social History   Tobacco Use  . Smoking status: Current Every Day Smoker    Packs/day: 0.50    Types: Cigarettes  . Smokeless tobacco: Never Used  . Tobacco comment: 2 cigarettes a day  Vaping Use  . Vaping Use: Never used  Substance Use Topics  . Alcohol use: Not Currently    Alcohol/week: 12.0 standard drinks    Types: 12 Shots of liquor per week    Comment: qo weekend  . Drug use: Not Currently    Types: Marijuana    Comment: Last use marijuna last week.  Last use of opiods was in June 2021      Review of Systems  Constitutional: Patient has fever.  Eyes: No visual changes. No discharge ENT: Patient has congestion.  Cardiovascular: no chest pain. Respiratory: Patient has cough.  Gastrointestinal: No abdominal pain.  No nausea, no vomiting. Patient had diarrhea.  Genitourinary: Negative for dysuria. No hematuria Musculoskeletal: Patient has myalgias.  Skin: Negative for rash, abrasions, lacerations, ecchymosis. Neurological: Patient has headache, no focal weakness or numbness.    ____________________________________________   PHYSICAL  EXAM:  VITAL SIGNS: ED Triage Vitals  Enc Vitals Group     BP 01/13/20 1345 (!) 142/97     Pulse Rate 01/13/20 1345 90     Resp 01/13/20 1345 20     Temp 01/13/20 1345 98.4 F (36.9 C)     Temp Source 01/13/20 1345 Oral     SpO2 01/13/20 1345 100 %     Weight 01/13/20 1343 200 lb (90.7 kg)     Height 01/13/20 1343 5\' 9"  (1.753 m)     Head Circumference --      Peak Flow --      Pain Score 01/13/20 1343 0     Pain Loc --      Pain Edu? --      Excl. in GC? --     Constitutional: Alert and oriented. Patient is lying supine. Eyes: Conjunctivae are normal. PERRL. EOMI. Head: Atraumatic. ENT:      Ears: Tympanic membranes are mildly injected with mild effusion bilaterally.       Nose: No congestion/rhinnorhea.      Mouth/Throat: Mucous membranes are moist. Posterior pharynx is mildly erythematous.  Hematological/Lymphatic/Immunilogical: No cervical lymphadenopathy.  Cardiovascular: Normal rate, regular rhythm. Normal S1 and S2.  Good peripheral circulation. Respiratory: Normal respiratory effort without tachypnea or retractions. Lungs CTAB. Good air entry to the bases with no decreased or absent breath sounds. Gastrointestinal: Bowel sounds 4 quadrants. Soft and nontender to palpation. No guarding or rigidity. No palpable masses. No distention. No CVA tenderness. Musculoskeletal: Full range of motion to all extremities. No gross deformities appreciated. Neurologic:  Normal speech and language. No gross focal neurologic deficits are appreciated.  Skin:  Skin is warm, dry and intact. No rash noted. Psychiatric: Mood and affect are normal. Speech and behavior are normal. Patient exhibits appropriate insight and judgement.   ____________________________________________   LABS (all labs ordered are listed, but only abnormal results are displayed)  Labs Reviewed  RESP PANEL BY RT-PCR (FLU A&B, COVID) ARPGX2 - Abnormal; Notable for the following components:      Result Value    SARS Coronavirus 2 by RT PCR POSITIVE (*)    All other components within normal limits   ____________________________________________  EKG   ____________________________________________  RADIOLOGY   No results found.  ____________________________________________    PROCEDURES  Procedure(s) performed:     Procedures     Medications - No data to display   ____________________________________________   INITIAL IMPRESSION / ASSESSMENT AND PLAN / ED COURSE  Pertinent labs & imaging results that were available during my care of the patient were reviewed by me and considered in my medical decision making (see chart for details).      Assessment and plan COVID-54 24 year old female presents to the emergency department with headache, cough, loss of taste and malaise that started last night.  Patient was mildly hypertensive at triage but vital signs were otherwise reassuring.  She tested positive for COVID-19 in the  emergency department.  Patient was discharged with albuterol and Zofran.  Rest and hydration were encouraged at home.  Return precautions were given to return with new or worsening symptoms.      ____________________________________________  FINAL CLINICAL IMPRESSION(S) / ED DIAGNOSES  Final diagnoses:  COVID-19      NEW MEDICATIONS STARTED DURING THIS VISIT:  ED Discharge Orders         Ordered    ondansetron (ZOFRAN ODT) 4 MG disintegrating tablet  Every 8 hours PRN        01/13/20 1557    albuterol (VENTOLIN HFA) 108 (90 Base) MCG/ACT inhaler  Every 6 hours PRN        01/13/20 1557              This chart was dictated using voice recognition software/Dragon. Despite best efforts to proofread, errors can occur which can change the meaning. Any change was purely unintentional.     Orvil Feil, PA-C 01/13/20 1947    Delton Prairie, MD 01/13/20 [phone]

## 2020-01-13 NOTE — ED Triage Notes (Signed)
Pt via POV from home. Pt c/o dry cough, headache, and loss of taste since last night . Pt states that she has been around someone with COVID. Denies taking home test. Pt is A&Ox4 and NAD

## 2020-08-29 IMAGING — US US FETAL BPP W/ NON-STRESS
1 series · 15 of 28 positions shown · non-contrast
Comparison: none

CLINICAL DATA: Decreased fetal movement, uterine size and date
discrepancy

EXAM:
OBSTETRICAL ULTRASOUND >14 WKS AND BIOPHYSICAL PROFILE

[Series 1: us fetal bpp w/ non-stress · 43 acquisitions, 15 frames shown]
[im 1/43]
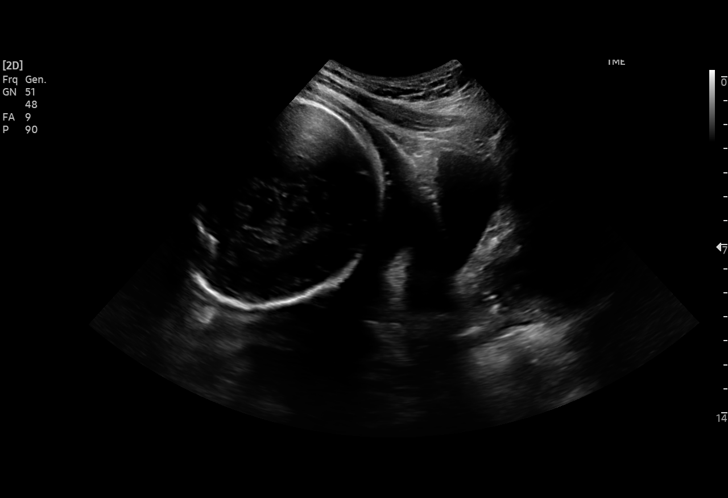
[im 4/43]
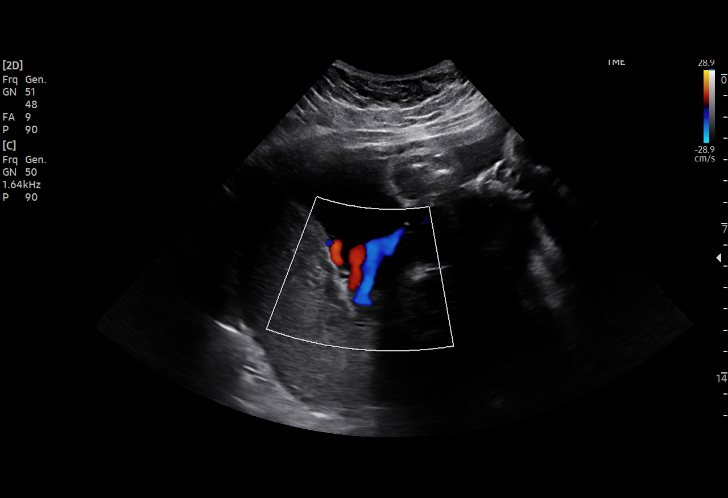
[im 7/43]
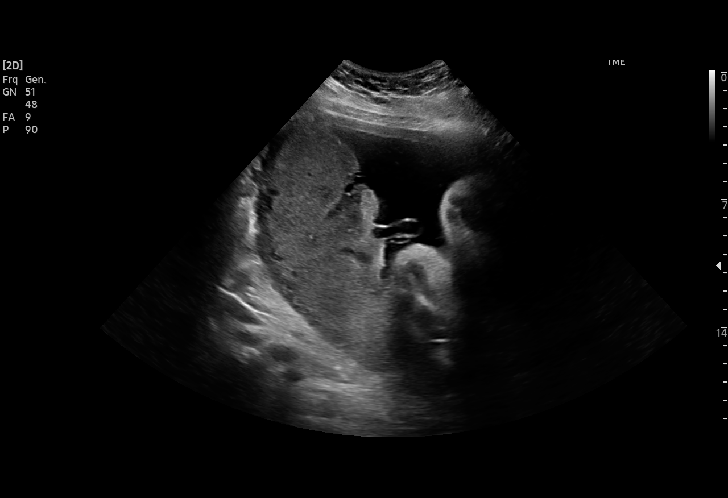
[im 10/43]
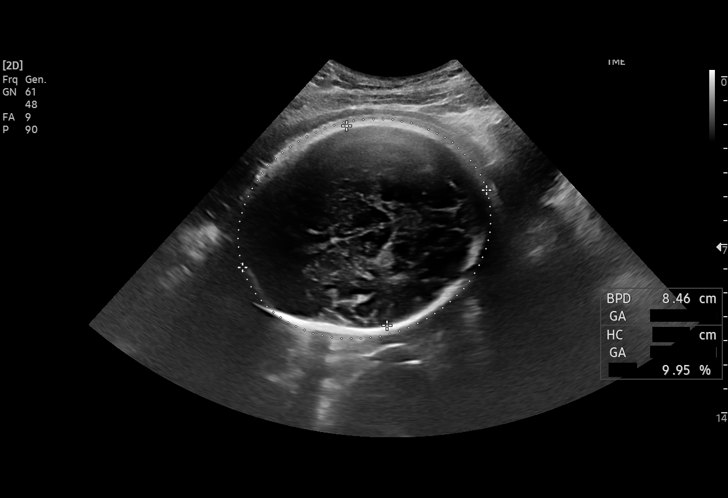
[im 13/43]
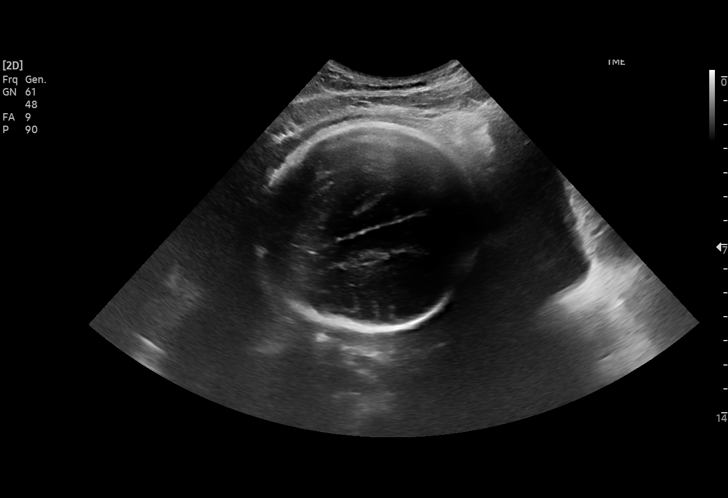
[im 16/43]
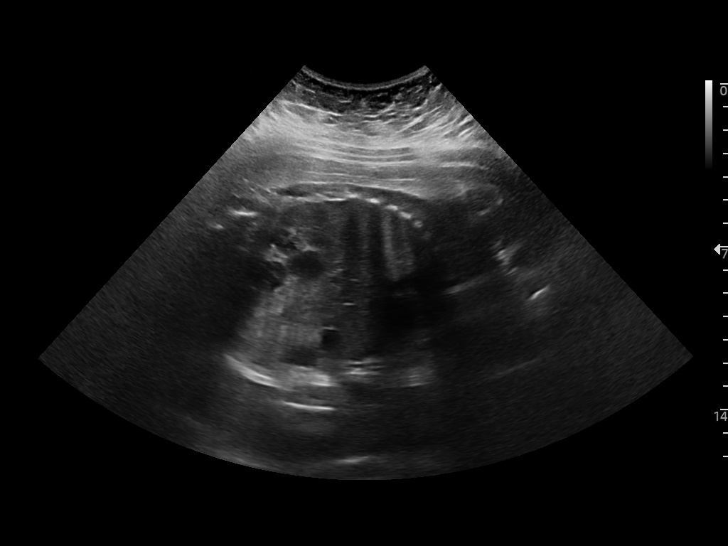
[im 19/43]
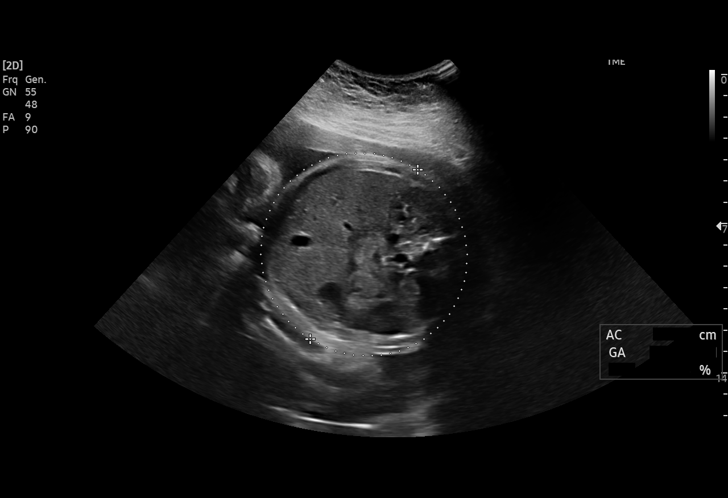
[im 22/43]
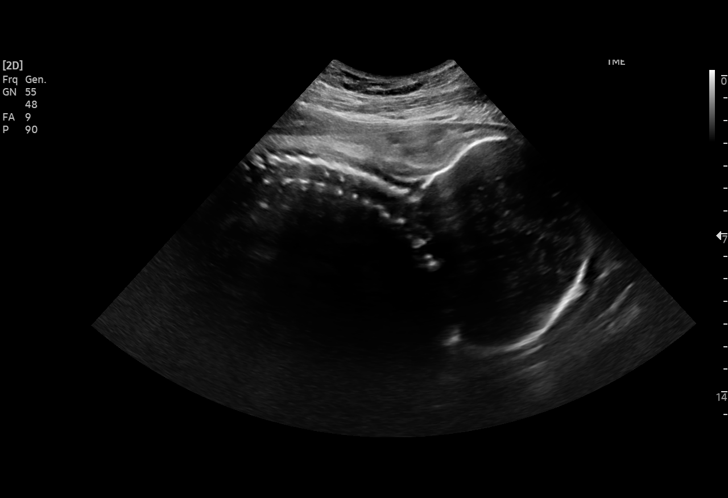
[im 24/43]
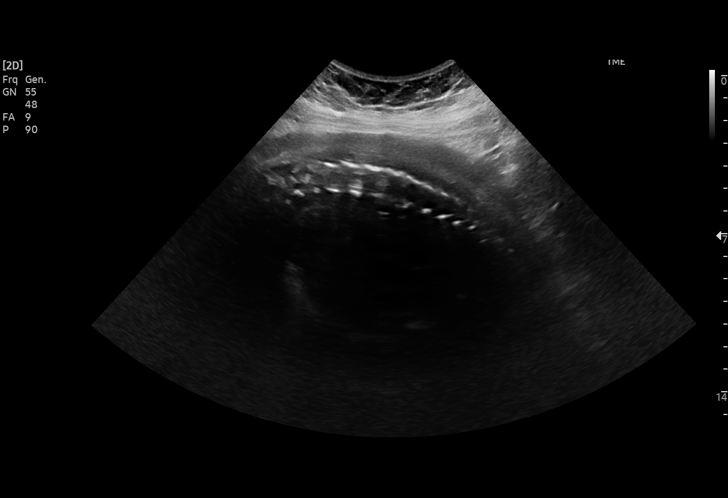
[im 27/43]
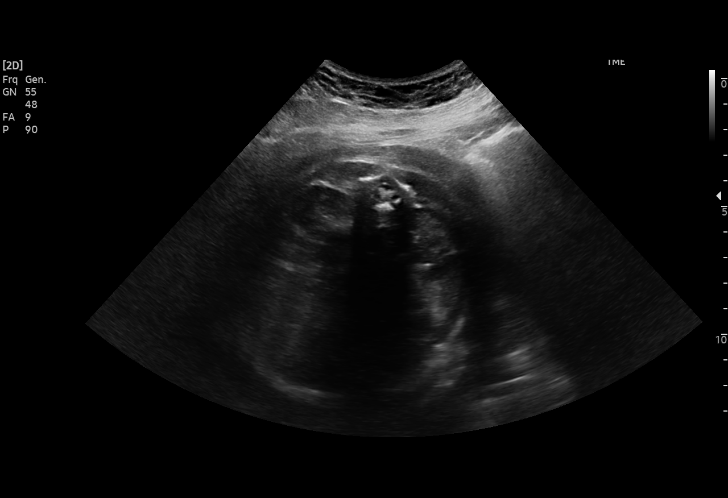
[im 30/43]
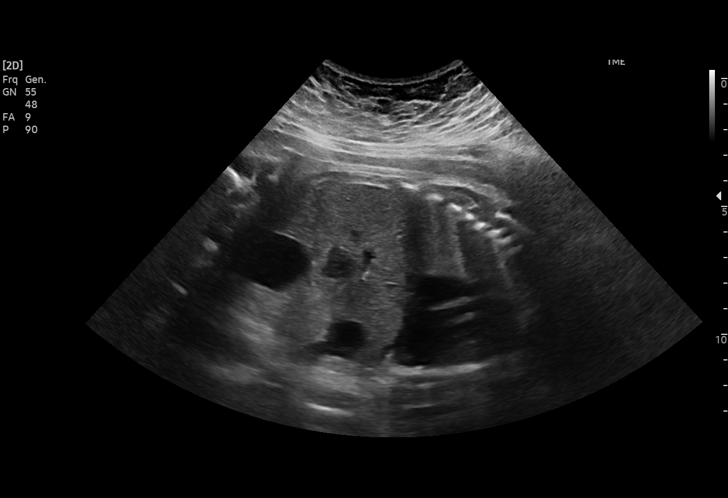
[im 33/43]
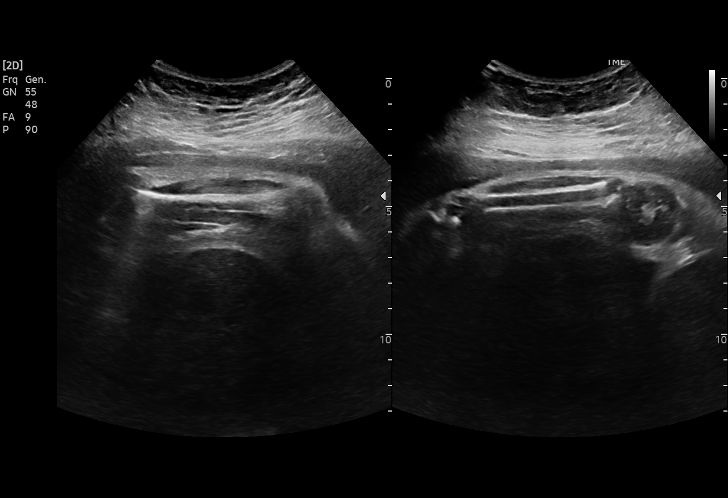
[im 36/43]
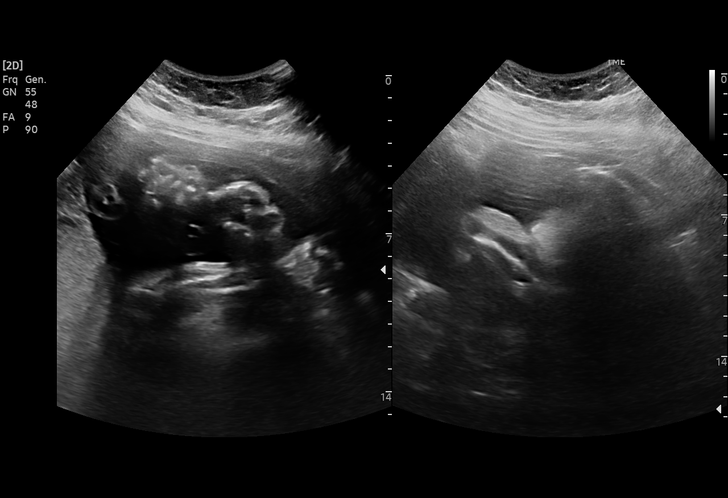
[im 39/43]
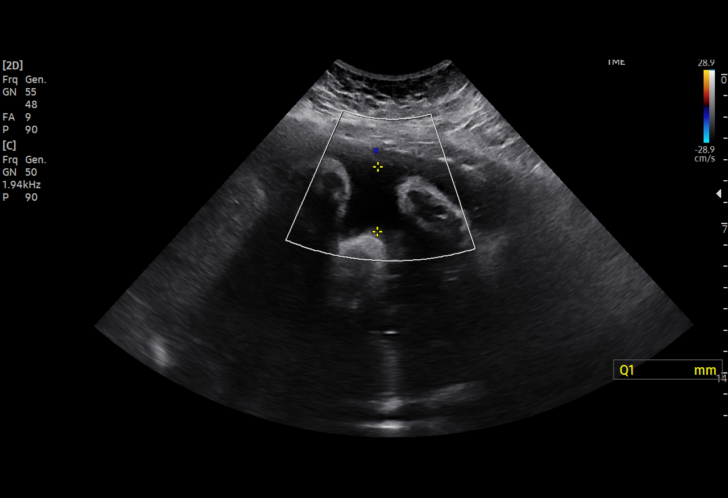
[im 43/43]
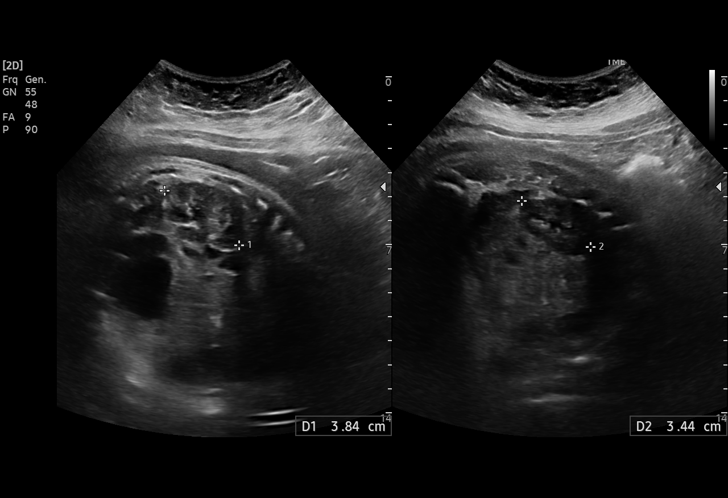

[15 of 28 positions shown; findings below may reference images not displayed]

FINDINGS: Number of Fetuses: 1

Heart Rate:  147 bpm

Movement: Yes

Presentation: Cephalic

Previa: Not evaluated on this exam

Placental Location: Posterior

Amniotic Fluid (Subjective): Normal

Amniotic Fluid (Objective):

Vertical pocket 6cm

AFI 10.0 cm (5%ile= 7.9 cm, 95%= 24.9 cm for 35 wks)

FETAL BIOMETRY

BPD:  8.4cm 33w 5d

HC:    30.5cm 34w 0d

AC:   30.1cm 34w 0d

FL:   6.0cm 31w 1d

Current Mean GA: 33w 2d US EDC: 10/25/2019

Assigned GA: 35w 0d Assigned EDC: 10/13/2019

Estimated Fetal Weight:  2,142g 25.5%ile

FETAL ANATOMY

Lateral Ventricles: Not visualized

Thalami/CSP: Appears normal

Posterior Fossa: Not visualized

Nuchal Region: Not visualized    NFT= N/A > 20 WKS

Upper Lip: Not visualized

Spine: Appears normal

4 Chamber Heart on Left: Appears normal

LVOT: Not visualized

RVOT: Not visualized

Stomach on Left: Appears normal

3 Vessel Cord: Appears normal

Cord Insertion site: Not visualized

Kidneys: Appears normal

Bladder: Appears normal

Extremities: Appears normal

Sex: Not Visualized

Technical Limitations: Late gestational age and fetal position

Maternal Findings:

Cervix:  Not evaluated

BIOPHYSICAL PROFILE

Movement: 2 time: 30 minutes

Breathing: 2

Tone:  2

Amniotic Fluid: 2

Total Score:  8
IMPRESSION: 1. Single live intrauterine pregnancy as above, estimated age 33
weeks and 2 days. Estimated fetal weight at the twenty-fifth
percentile. With
2. Limited fetal anatomic survey due to late gestational age. Please
see discussion above.
3. Biophysical profile [DATE].

Biophysical profile score is  [DATE].

## 2020-09-27 IMAGING — US US OB LIMITED
1 series · 14 of 28 positions shown · non-contrast
Comparison: none

CLINICAL DATA: Decreased fetal movement.

EXAM:
LIMITED OBSTETRIC ULTRASOUND AND BIOPHYSICAL PROFILE

[Series 1: us ob limited · 0.31mm/px · 41 acquisitions, 14 frames shown]
[im 2/41]
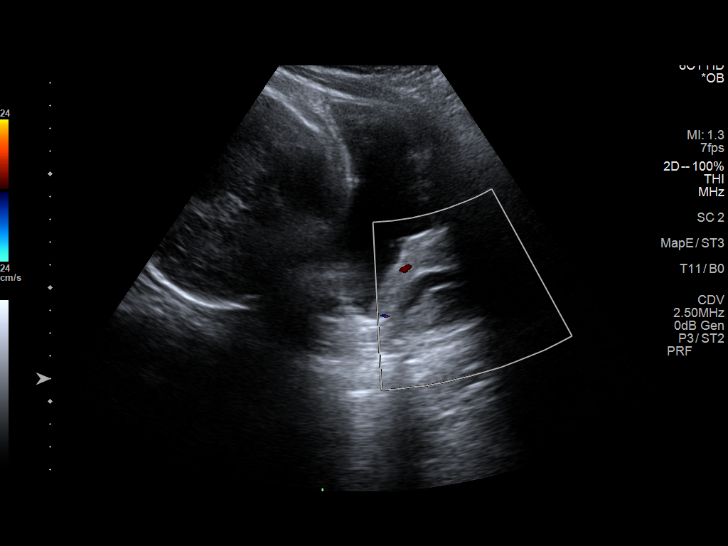
[im 5/41]
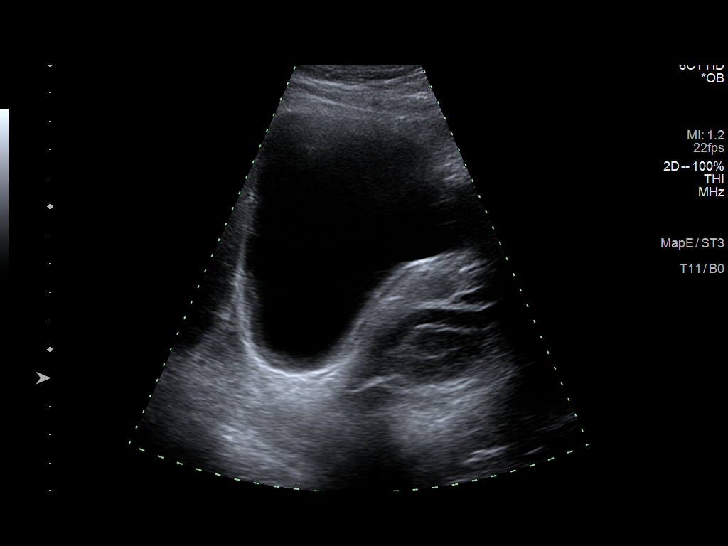
[im 8/41]
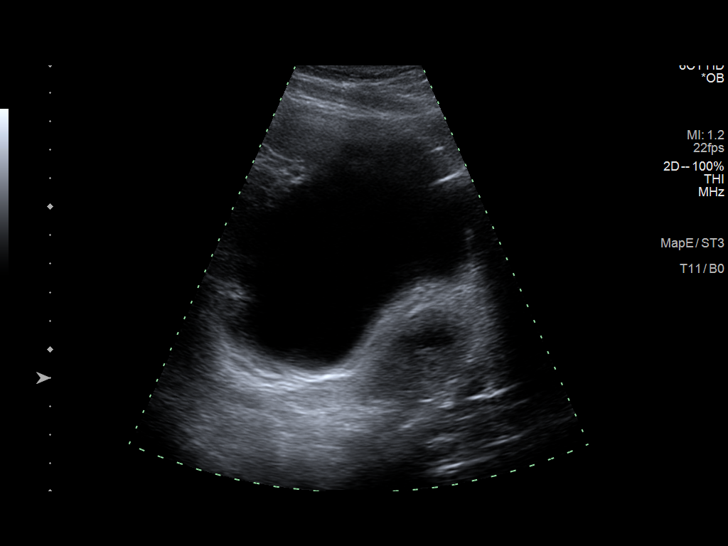
[im 11/41]
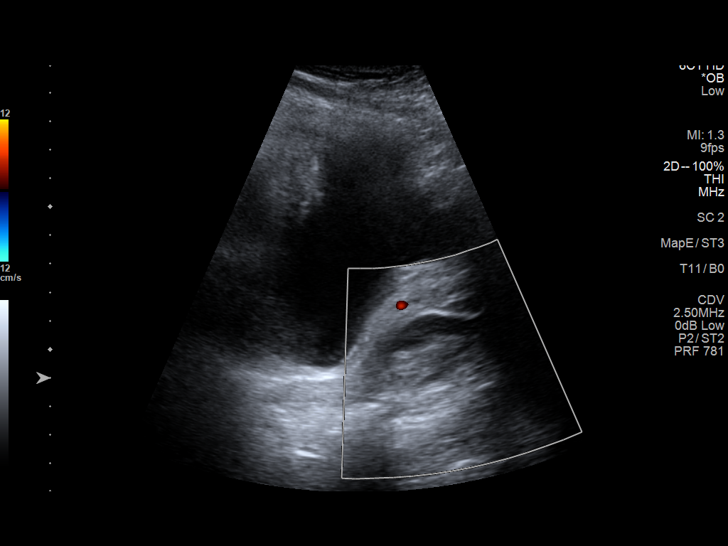
[im 14/41]
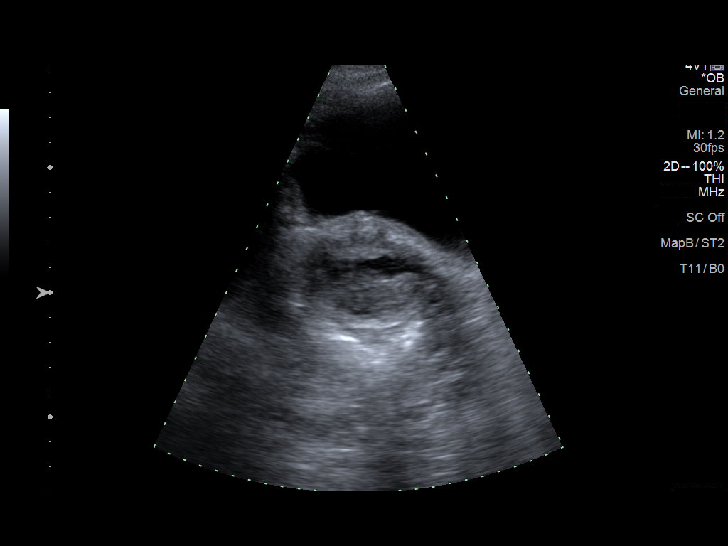
[im 17/41]
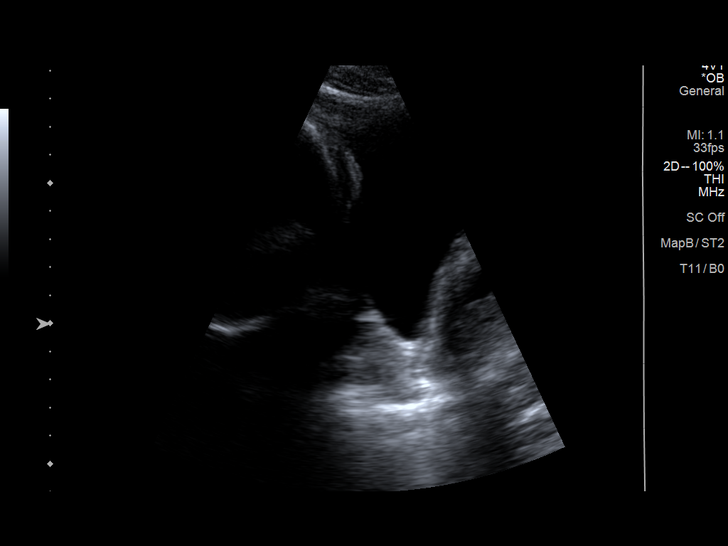
[im 20/41]
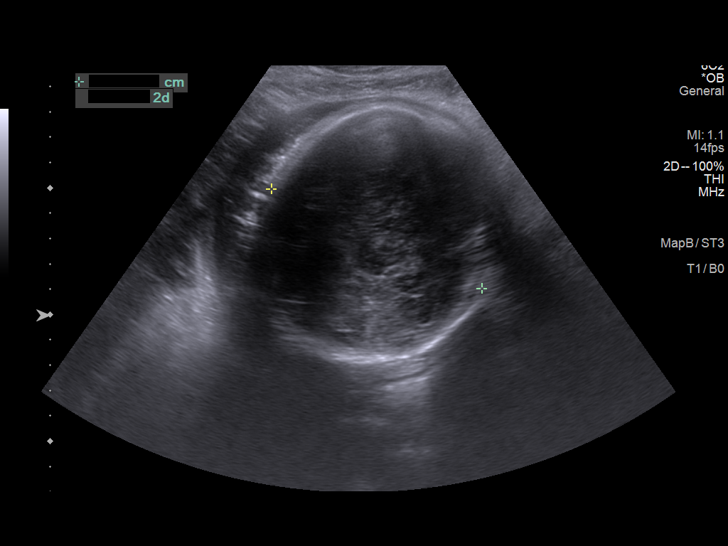
[im 23/41]
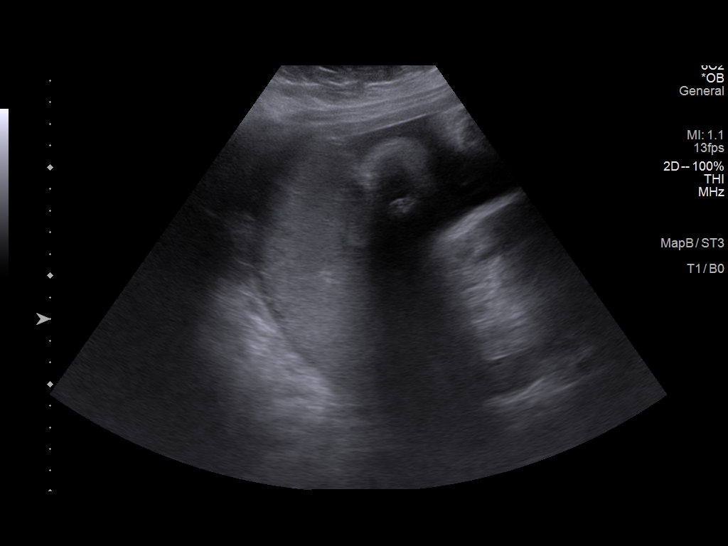
[im 26/41]
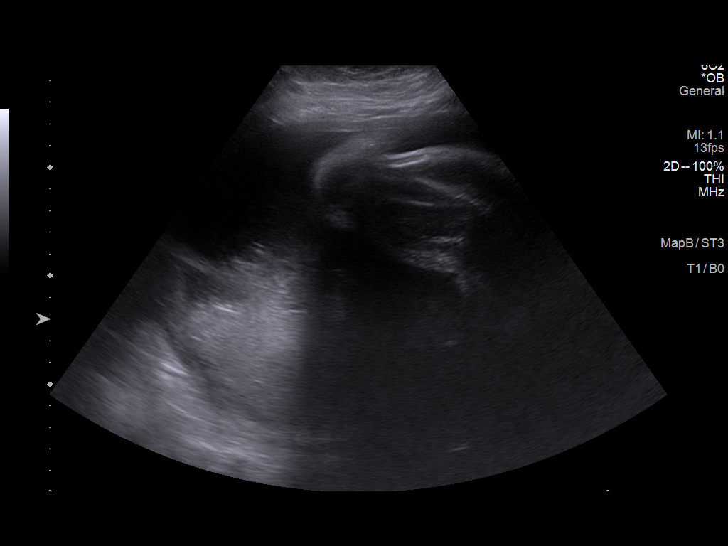
[im 29/41]
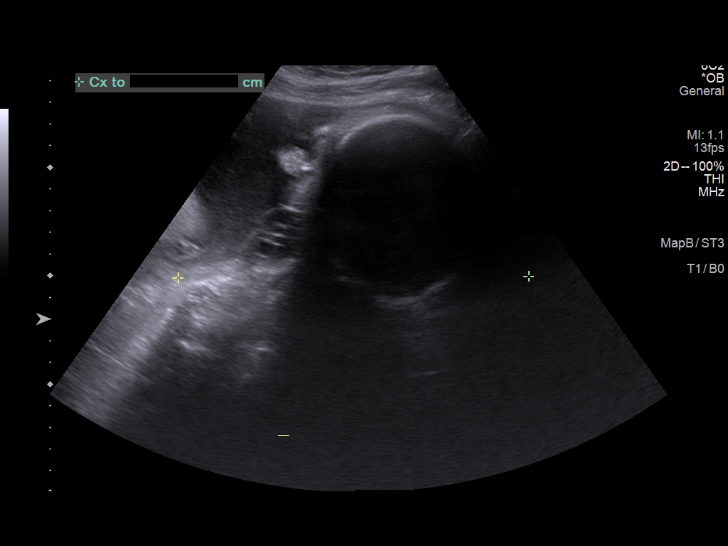
[im 32/41]
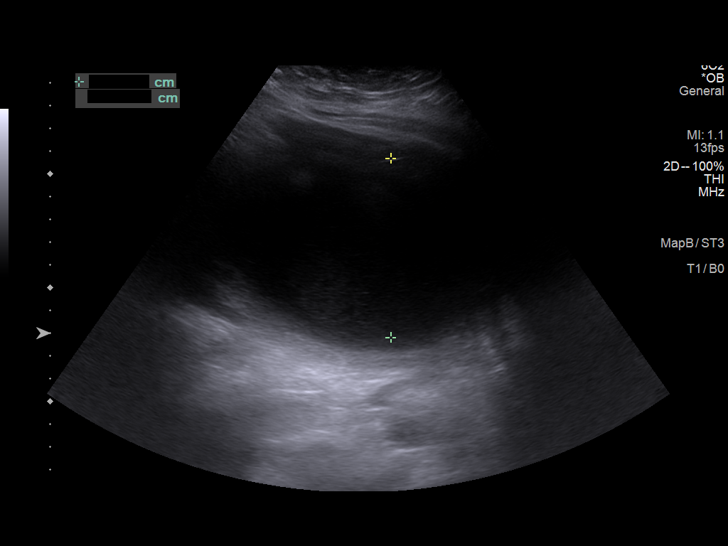
[im 35/41]
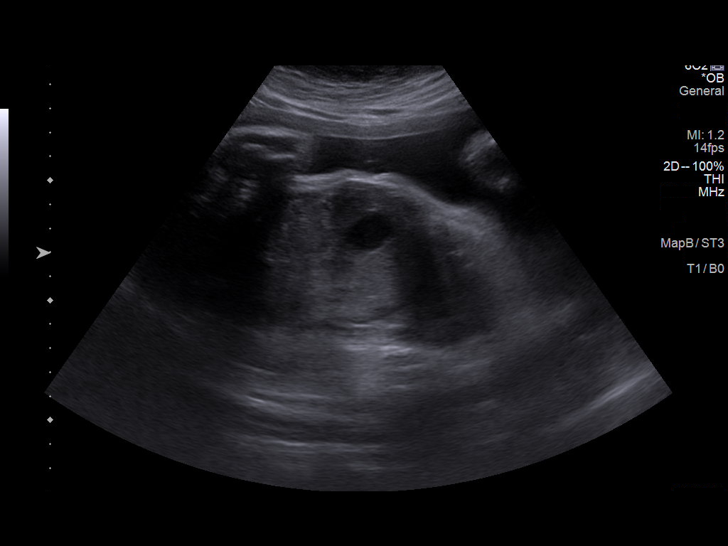
[im 38/41]
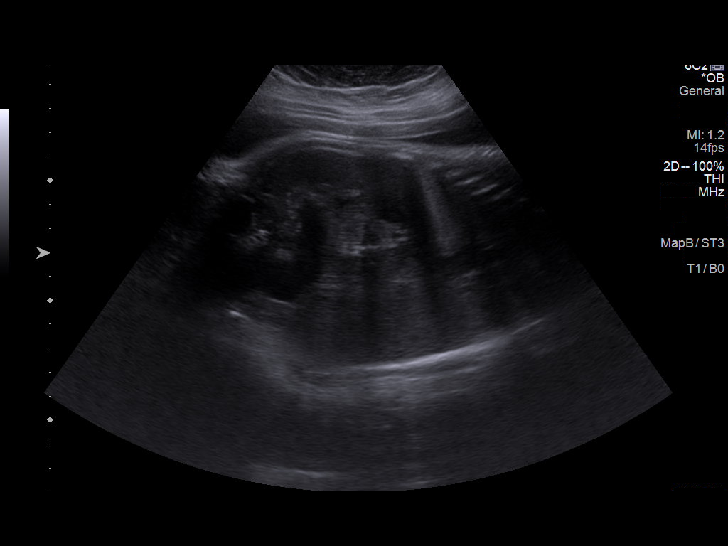
[im 41/41]
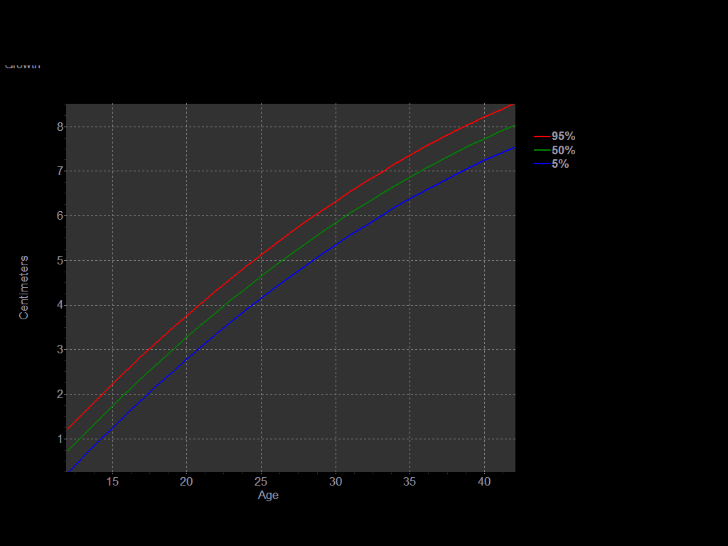

[14 of 28 positions shown; findings below may reference images not displayed]

FINDINGS: Heart Rate:  141 bpm

Movement: Yes

Presentation: Cephalic

Placental Location: Posterior

Previa: No

Amniotic Fluid (Subjective): Within normal limits.

AFI: 21.7 cm

BPD: 7.3 cm 37 w  3 d

MATERNAL FINDINGS:

Cervix:  Appears closed.

Uterus/Adnexae: A 4.3 cm x 2.4 cm x 5.1 cm complex hypoechoic and
anechoic structure is seen within the soft tissue structures of the
vagina. No flow is seen within this region on color Doppler
evaluation.

Movement:  2  Time:

Breathing: 0

Tone:  2

Amniotic Fluid: 2

Total Score:  6
IMPRESSION: Single, viable intrauterine pregnancy at approximately 37 weeks and
3 days gestation by ultrasound evaluation.

Biophysical profile score is [DATE].

This exam is performed on an emergent basis and does not
comprehensively evaluate fetal size, dating, or anatomy; follow-up
complete OB US should be considered if further fetal assessment is
warranted.

## 2022-12-23 ENCOUNTER — Emergency Department
Admission: EM | Admit: 2022-12-23 | Discharge: 2022-12-23 | Disposition: A | Discharge status: Home / Self Care | Payer: 59 | Attending: Emergency Medicine | Admitting: Emergency Medicine

## 2022-12-23 ENCOUNTER — Other Ambulatory Visit: Payer: Self-pay

## 2022-12-23 DIAGNOSIS — K529 Noninfective gastroenteritis and colitis, unspecified: Secondary | ICD-10-CM | POA: Insufficient documentation

## 2022-12-23 DIAGNOSIS — F1490 Cocaine use, unspecified, uncomplicated: Secondary | ICD-10-CM | POA: Diagnosis not present

## 2022-12-23 DIAGNOSIS — R197 Diarrhea, unspecified: Secondary | ICD-10-CM | POA: Diagnosis not present

## 2022-12-23 DIAGNOSIS — F1290 Cannabis use, unspecified, uncomplicated: Secondary | ICD-10-CM | POA: Insufficient documentation

## 2022-12-23 DIAGNOSIS — A09 Infectious gastroenteritis and colitis, unspecified: Secondary | ICD-10-CM | POA: Diagnosis not present

## 2022-12-23 DIAGNOSIS — F1990 Other psychoactive substance use, unspecified, uncomplicated: Secondary | ICD-10-CM

## 2022-12-23 DIAGNOSIS — F1999 Other psychoactive substance use, unspecified with unspecified psychoactive substance-induced disorder: Secondary | ICD-10-CM | POA: Diagnosis not present

## 2022-12-23 DIAGNOSIS — R112 Nausea with vomiting, unspecified: Secondary | ICD-10-CM | POA: Diagnosis not present

## 2022-12-23 LAB — URINALYSIS, ROUTINE W REFLEX MICROSCOPIC
Bilirubin Urine: NEGATIVE
Glucose, UA: NEGATIVE mg/dL
Hgb urine dipstick: NEGATIVE
Ketones, ur: 20 mg/dL — AB
Nitrite: NEGATIVE
Protein, ur: 30 mg/dL — AB
Specific Gravity, Urine: 1.018 (ref 1.005–1.030)
WBC, UA: >50 WBC/hpf (ref 0–5)
pH: 5 (ref 5.0–8.0)

## 2022-12-23 LAB — URINE DRUG SCREEN, QUALITATIVE (ARMC ONLY)
Amphetamines, Ur Screen: NOT DETECTED
Barbiturates, Ur Screen: NOT DETECTED
Benzodiazepine, Ur Scrn: NOT DETECTED
Cannabinoid 50 Ng, Ur ~~LOC~~: POSITIVE — AB
Cocaine Metabolite,Ur ~~LOC~~: POSITIVE — AB
MDMA (Ecstasy)Ur Screen: NOT DETECTED
Methadone Scn, Ur: NOT DETECTED
Opiate, Ur Screen: NOT DETECTED
Phencyclidine (PCP) Ur S: NOT DETECTED
Tricyclic, Ur Screen: NOT DETECTED

## 2022-12-23 LAB — COMPREHENSIVE METABOLIC PANEL
ALT: 23 U/L (ref 0–44)
AST: 18 U/L (ref 15–41)
Albumin: 4.7 g/dL (ref 3.5–5.0)
Alkaline Phosphatase: 88 U/L (ref 38–126)
Anion gap: 10 (ref 5–15)
BUN: 11 mg/dL (ref 6–20)
CO2: 28 mmol/L (ref 22–32)
Calcium: 9.4 mg/dL (ref 8.9–10.3)
Chloride: 105 mmol/L (ref 98–111)
Creatinine, Ser: 0.75 mg/dL (ref 0.44–1.00)
GFR, Estimated: >60 mL/min (ref >60)
Glucose, Bld: 108 mg/dL — ABNORMAL HIGH (ref 70–99)
Potassium: 4.2 mmol/L (ref 3.5–5.1)
Sodium: 143 mmol/L (ref 135–145)
Total Bilirubin: 0.6 mg/dL (ref <1.2)
Total Protein: 8.9 g/dL — ABNORMAL HIGH (ref 6.5–8.1)

## 2022-12-23 LAB — PREGNANCY, URINE: Preg Test, Ur: NEGATIVE

## 2022-12-23 LAB — CBC
HCT: 40.6 % (ref 36.0–46.0)
Hemoglobin: 14.1 g/dL (ref 12.0–15.0)
MCH: 28.8 pg (ref 26.0–34.0)
MCHC: 34.7 g/dL (ref 30.0–36.0)
MCV: 82.9 fL (ref 80.0–100.0)
Platelets: 530 10*3/uL — ABNORMAL HIGH (ref 150–400)
RBC: 4.9 MIL/uL (ref 3.87–5.11)
RDW: 13.1 % (ref 11.5–15.5)
WBC: 8.5 10*3/uL (ref 4.0–10.5)
nRBC: 0 % (ref 0.0–0.2)

## 2022-12-23 LAB — LIPASE, BLOOD: Lipase: 29 U/L (ref 11–51)

## 2022-12-23 MED ORDER — FAMOTIDINE 20 MG PO TABS: 20.0000 mg | ORAL_TABLET | Freq: Two times a day (BID) | ORAL | 0 refills | Status: DC

## 2022-12-23 MED ORDER — ONDANSETRON 4 MG PO TBDP
4.0000 mg | ORAL_TABLET | Freq: Once | ORAL | Status: AC
  Administered 2022-12-23: 4 mg via ORAL
  Filled 2022-12-23: qty 1

## 2022-12-23 MED ORDER — HYDROCODONE-ACETAMINOPHEN 5-325 MG PO TABS
1.0000 | ORAL_TABLET | Freq: Once | ORAL | Status: AC
  Administered 2022-12-23: 1 via ORAL
  Filled 2022-12-23 (×2): qty 1

## 2022-12-23 MED ORDER — SODIUM CHLORIDE 0.9 % IV BOLUS
1000.0000 mL | Freq: Once | INTRAVENOUS | Status: AC
  Administered 2022-12-23: 1000 mL via INTRAVENOUS

## 2022-12-23 MED ORDER — METOCLOPRAMIDE HCL 10 MG PO TABS: 10.0000 mg | ORAL_TABLET | Freq: Three times a day (TID) | ORAL | 0 refills | Status: DC

## 2022-12-23 MED ORDER — FAMOTIDINE IN NACL 20-0.9 MG/50ML-% IV SOLN
20.0000 mg | Freq: Once | INTRAVENOUS | Status: AC
  Administered 2022-12-23: 20 mg via INTRAVENOUS
  Filled 2022-12-23: qty 50

## 2022-12-23 MED ORDER — METOCLOPRAMIDE HCL 5 MG/ML IJ SOLN
10.0000 mg | Freq: Once | INTRAMUSCULAR | Status: AC
  Administered 2022-12-23: 10 mg via INTRAVENOUS
  Filled 2022-12-23: qty 2

## 2022-12-23 MED ORDER — DICYCLOMINE HCL 10 MG PO CAPS
10.0000 mg | ORAL_CAPSULE | Freq: Once | ORAL | Status: AC
  Administered 2022-12-23: 10 mg via ORAL
  Filled 2022-12-23: qty 1

## 2022-12-23 MED ORDER — DICYCLOMINE HCL 10 MG PO CAPS: 10.0000 mg | ORAL_CAPSULE | Freq: Three times a day (TID) | ORAL | 0 refills | Status: DC

## 2022-12-23 NOTE — Discharge Instructions (Addendum)
Your exam and labs overall reassuring at this time.  Your symptoms may be due to a viral gastroenteritis.  Take the prescription medicines as directed.  You are also advised to follow-up with [facility], for your concern for and desire for substance abuse counseling and treatment.  Follow-up with your primary provider return to the ED if needed.

## 2022-12-23 NOTE — ED Provider Notes (Signed)
[facility] Provider Note     Event Date/Time   First MD Initiated Contact with Patient 12/23/22 1724     (approximate)   History   Abdominal Pain   HPI  Maureen Cox is a 27 y.o. female with a history of obesity, PTSD, depression, and marijuana use disorder, presents to the ED from home.  She would endorse nausea, vomiting, diarrhea with onset this morning.  She reports associated lower abdominal discomfort.  No fevers, chills, sweats, chest pain reported.  She denies any sick contacts, recent travel, or bad food exposures.  She gives a surgical history that includes a cholecystectomy.   Physical Exam   Triage Vital Signs: ED Triage Vitals [12/23/22 1437]  Encounter Vitals Group     BP (!) 141/88     Systolic BP Percentile      Diastolic BP Percentile      Pulse Rate 81     Resp 20     Temp 98.6 F (37 C)     Temp Source Oral     SpO2 98 %     Weight      Height      Head Circumference      Peak Flow      Pain Score 6     Pain Loc      Pain Education      Exclude from Growth Chart     Most recent vital signs: Vitals:   12/23/22 1437  BP: (!) 141/88  Pulse: 81  Resp: 20  Temp: 98.6 F (37 C)  SpO2: 98%    General Awake, no distress. *** {**HEENT NCAT. PERRL. EOMI. No rhinorrhea. Mucous membranes are moist. **} CV:  Good peripheral perfusion. *** RESP:  Normal effort. *** ABD:  No distention. *** {**Other: **}   ED Results / Procedures / Treatments   Labs (all labs ordered are listed, but only abnormal results are displayed) Labs Reviewed  COMPREHENSIVE METABOLIC PANEL - Abnormal; Notable for the following components:      Result Value   Glucose, Bld 108 (*)    Total Protein 8.9 (*)    All other components within normal limits  CBC - Abnormal; Notable for the following components:   Platelets 530 (*)    All other components within normal limits  LIPASE, BLOOD  URINALYSIS, ROUTINE W REFLEX  MICROSCOPIC  POC URINE PREG, ED     EKG   RADIOLOGY  I personally viewed and evaluated these images as part of my medical decision making, as well as reviewing the written report by the radiologist.  ED Provider Interpretation: ***  No results found.   PROCEDURES:  Critical Care performed: No  Procedures   MEDICATIONS ORDERED IN ED: Medications - No data to display   IMPRESSION / MDM / ASSESSMENT AND PLAN / ED COURSE  I reviewed the triage vital signs and the nursing notes.                              Differential diagnosis includes, but is not limited to, ovarian cyst, ovarian torsion, acute appendicitis, diverticulitis, urinary tract infection/pyelonephritis, bowel obstruction, colitis, renal colic, gastroenteritis, hernia, fibroids, endometriosis, pregnancy related pain including ectopic pregnancy, etc.   Patient's presentation is most consistent with acute complicated illness / injury requiring diagnostic workup.  Patient's diagnosis is consistent with ***. Patient will be discharged home with prescriptions for ***.  Patient is to follow up with *** as needed or otherwise directed. Patient is given ED precautions to return to the ED for any worsening or new symptoms.     FINAL CLINICAL IMPRESSION(S) / ED DIAGNOSES   Final diagnoses:  None     Rx / DC Orders   ED Discharge Orders     None        Note:  This document was prepared using Dragon voice recognition software and may include unintentional dictation errors.

## 2022-12-23 NOTE — ED Triage Notes (Signed)
Pt to ED via POV from home. Pt reports N/V/D and abd pain that started this morning. Pt denies sick contacts. Pt has had gallbladder removed.

## 2023-01-10 ENCOUNTER — Telehealth: Payer: Self-pay | Admitting: *Deleted

## 2023-01-10 NOTE — Progress Notes (Signed)
Transition Care Management Unsuccessful Follow-up Telephone Call  Date of discharge and from where:  [facility]12/05/2022  Attempts:  1st Attempt  Reason for unsuccessful TCM follow-up call:  Left voice message

## 2023-07-10 ENCOUNTER — Encounter

## 2023-07-18 ENCOUNTER — Ambulatory Visit
Admission: EM | Admit: 2023-07-18 | Discharge: 2023-07-18 | Disposition: A | Discharge status: Home / Self Care | Attending: Physician Assistant | Admitting: Physician Assistant

## 2023-07-18 DIAGNOSIS — Z3201 Encounter for pregnancy test, result positive: Secondary | ICD-10-CM | POA: Insufficient documentation

## 2023-07-18 DIAGNOSIS — N3001 Acute cystitis with hematuria: Secondary | ICD-10-CM | POA: Insufficient documentation

## 2023-07-18 DIAGNOSIS — R112 Nausea with vomiting, unspecified: Secondary | ICD-10-CM | POA: Insufficient documentation

## 2023-07-18 DIAGNOSIS — Z113 Encounter for screening for infections with a predominantly sexual mode of transmission: Secondary | ICD-10-CM | POA: Diagnosis present

## 2023-07-18 LAB — BASIC METABOLIC PANEL WITH GFR
Anion gap: 12 (ref 5–15)
BUN: 7 mg/dL (ref 6–20)
CO2: 23 mmol/L (ref 22–32)
Calcium: 9.4 mg/dL (ref 8.9–10.3)
Chloride: 97 mmol/L — ABNORMAL LOW (ref 98–111)
Creatinine, Ser: 0.77 mg/dL (ref 0.44–1.00)
GFR, Estimated: >60 mL/min (ref >60)
Glucose, Bld: 92 mg/dL (ref 70–99)
Potassium: 3.7 mmol/L (ref 3.5–5.1)
Sodium: 132 mmol/L — ABNORMAL LOW (ref 135–145)

## 2023-07-18 LAB — PREGNANCY, URINE: Preg Test, Ur: POSITIVE — AB

## 2023-07-18 LAB — URINALYSIS, DIPSTICK ONLY
Glucose, UA: NEGATIVE mg/dL
Ketones, ur: >160 mg/dL — AB
Nitrite: NEGATIVE
Protein, ur: >300 mg/dL — AB
Specific Gravity, Urine: 1.025 (ref 1.005–1.030)
pH: 6.5 (ref 5.0–8.0)

## 2023-07-18 MED ORDER — CEFDINIR 300 MG PO CAPS: 300.0000 mg | ORAL_CAPSULE | Freq: Two times a day (BID) | ORAL | 0 refills | Status: AC

## 2023-07-18 MED ORDER — DOXYLAMINE-PYRIDOXINE 10-10 MG PO TBEC: DELAYED_RELEASE_TABLET | ORAL | 1 refills | Status: DC

## 2023-07-18 MED ORDER — ONDANSETRON 4 MG PO TBDP
4.0000 mg | ORAL_TABLET | Freq: Once | ORAL | Status: AC
Start: 2023-07-18 — End: 2023-07-18
  Administered 2023-07-18: 4 mg via ORAL

## 2023-07-18 NOTE — Discharge Instructions (Addendum)
 UTI: Based on either symptoms or urinalysis, you may have a urinary tract infection. We will send the urine for culture and call with results in a few days. Begin antibiotics at this time. Your symptoms should be much improved over the next 2-3 days. Increase rest and fluid intake. If for some reason symptoms are worsening or not improving after a couple of days or the urine culture determines the antibiotics you are taking will not treat the infection, the antibiotics may be changed. Return or go to ER for fever, back pain, worsening urinary pain, discharge, increased blood in urine. May take Tylenol  for pain relief   -Pending vaginal swab results. We will call in a few days if you need any other meds or changes to the ones you have been prescribed today  -Referral put in for OB/GYN. If problems with referral request one through primary doctor/provider.

## 2023-07-18 NOTE — ED Provider Notes (Signed)
 [facility]    CSN: [id number] Arrival date & time: 07/18/23  1331      History   Chief Complaint Chief Complaint  Patient presents with   Emesis   Nausea   Diarrhea    HPI Maureen Cox is a 28 y.o. female with history of PTSD, depression, tobacco abuse, and substance abuse disorder. Today, patient presents for ~4-day history of nausea and vomiting. Reports she started having diarrhea today. Reports suprapubic pressure and malodorous urine. No fever, body aches, headaches, cough, congestion, chest pain, abdominal pain, abnormal vaginal bleeding, dysuria, urgency or frequency of urination. Patient reports having a positive pregnancy test last week. LMP 05/06/23. Patient says she has not smoked cigarettes in 4 days. Reports smoking marijuana yesterday. Denies alcohol or other drug use recently. No recent OTC meds taken.  HPI  Past Medical History:  Diagnosis Date   Depression    PTSD (post-traumatic stress disorder)     Patient Active Problem List   Diagnosis Date Noted   Decreased fetal movement affecting management of pregnancy in third trimester 10/07/2019   Vaginal mass 09/13/2019   Pelvic pain in pregnancy 09/08/2019   Abdominal pain during pregnancy, second trimester 06/19/2019   Morbid obesity (HCC) 210 lbs 11/12/2018   PTSD (post-traumatic stress disorder) 2020 11/12/2018   Smoker 1/2-1.5 ppd 11/12/2018   Marijuana abuse 11/12/2018    Past Surgical History:  Procedure Laterality Date   CESAREAN SECTION  10/08/2019   Procedure: CESAREAN SECTION;  Surgeon: Lovetta, Debby PARAS, MD;  Location: [facility] ORS;  Service: Obstetrics;;   NO PAST SURGERIES      OB History     Gravida  1   Para  1   Term  1   Preterm      AB      Living  1      SAB      IAB      Ectopic      Multiple  0   Live Births  1            Home Medications    Prior to Admission medications   Medication Sig Start Date End Date Taking? Authorizing Provider   cefdinir (OMNICEF) 300 MG capsule Take 1 capsule (300 mg total) by mouth 2 (two) times daily for 7 days. 07/18/23 07/25/23 Yes Arvis Jolan NOVAK, PA-C  Doxylamine-Pyridoxine (DICLEGIS) 10-10 MG TBEC Take 2 tabs PO at bed time x 2 days. Increase to 2 tabs in am and 1-2 tabs at bed time thereafter as needed for nausea/vomiting 07/18/23  Yes Arvis Jolan NOVAK, PA-C  acetaminophen  (TYLENOL ) 500 MG tablet Take 2 tablets (1,000 mg total) by mouth every 6 (six) hours. 10/10/19   McVey, Asberry LABOR, CNM  albuterol  (VENTOLIN  HFA) 108 (90 Base) MCG/ACT inhaler Inhale 2 puffs into the lungs every 6 (six) hours as needed for wheezing or shortness of breath. 01/13/20   Woods, Jaclyn M, PA-C  dicyclomine  (BENTYL ) 10 MG capsule Take 1 capsule (10 mg total) by mouth 3 (three) times daily before meals for 10 days. 12/23/22 01/02/23  Menshew, Candida LULLA Kings, PA-C  famotidine  (PEPCID ) 20 MG tablet Take 1 tablet (20 mg total) by mouth 2 (two) times daily for 20 days. 12/23/22 01/12/23  Menshew, Candida LULLA Kings, PA-C  ferrous sulfate  325 (65 FE) MG tablet Take 1 tablet (325 mg total) by mouth 2 (two) times daily with a meal. 10/10/19   McVey, Asberry LABOR, CNM  ibuprofen  (ADVIL ) 800  MG tablet Take 1 tablet (800 mg total) by mouth every 6 (six) hours. 10/10/19   McVey, Asberry LABOR, CNM  metoCLOPramide  (REGLAN ) 10 MG tablet Take 1 tablet (10 mg total) by mouth 3 (three) times daily with meals for 10 days. 12/23/22 01/02/23  Menshew, Candida LULLA Kings, PA-C  senna-docusate (SENOKOT-S) 8.6-50 MG tablet Take 2 tablets by mouth daily. 10/10/19   McVey, Asberry LABOR, CNM  simethicone  (MYLICON) 80 MG chewable tablet Chew 1 tablet (80 mg total) by mouth 3 (three) times daily after meals. 10/10/19   McVey, Asberry LABOR, CNM    Family History History reviewed. No pertinent family history.  Social History Social History   Tobacco Use   Smoking status: Every Day    Current packs/day: 0.50    Types: Cigarettes   Smokeless tobacco: Never   Tobacco comments:     2 cigarettes a day  Vaping Use   Vaping status: Never Used  Substance Use Topics   Alcohol use: Not Currently    Alcohol/week: 12.0 standard drinks of alcohol    Types: 12 Shots of liquor per week    Comment: qo weekend   Drug use: Not Currently    Types: Marijuana    Comment: Last use marijuna last week.  Last use of opiods was in June 2021     Allergies   Patient has no known allergies.   Review of Systems Review of Systems  Constitutional:  Positive for appetite change and fatigue. Negative for fever.  HENT:  Negative for congestion, rhinorrhea and sore throat.   Respiratory:  Negative for cough and shortness of breath.   Cardiovascular:  Negative for chest pain.  Gastrointestinal:  Positive for diarrhea, nausea and vomiting. Negative for abdominal pain.  Genitourinary:  Negative for difficulty urinating, dysuria, frequency and vaginal bleeding.  Musculoskeletal:  Negative for myalgias.  Neurological:  Negative for dizziness and headaches.     Physical Exam Triage Vital Signs ED Triage Vitals  Encounter Vitals Group     BP 07/18/23 1344 128/85     Girls Systolic BP Percentile --      Girls Diastolic BP Percentile --      Boys Systolic BP Percentile --      Boys Diastolic BP Percentile --      Pulse Rate 07/18/23 1344 75     Resp 07/18/23 1344 17     Temp 07/18/23 1344 98.5 F (36.9 C)     Temp Source 07/18/23 1344 Oral     SpO2 07/18/23 1344 98 %     Weight --      Height --      Head Circumference --      Peak Flow --      Pain Score 07/18/23 1342 0     Pain Loc --      Pain Education --      Exclude from Growth Chart --    No data found.  Updated Vital Signs BP 128/85 (BP Location: Right Arm)   Pulse 75   Temp 98.5 F (36.9 C) (Oral)   Resp 17   LMP 05/06/2023 (Exact Date)   SpO2 98%      Physical Exam Vitals and nursing note reviewed.  Constitutional:      General: She is not in acute distress.    Appearance: Normal appearance. She is not  ill-appearing or toxic-appearing.  HENT:     Head: Normocephalic and atraumatic.     Nose: Nose normal.  Mouth/Throat:     Mouth: Mucous membranes are moist.     Pharynx: Oropharynx is clear.   Eyes:     General: No scleral icterus.       Right eye: No discharge.        Left eye: No discharge.     Conjunctiva/sclera: Conjunctivae normal.    Cardiovascular:     Rate and Rhythm: Normal rate and regular rhythm.     Heart sounds: Normal heart sounds.  Pulmonary:     Effort: Pulmonary effort is normal. No respiratory distress.     Breath sounds: Normal breath sounds.  Abdominal:     Palpations: Abdomen is soft.     Tenderness: There is abdominal tenderness (suprapubic mild TTP). There is no guarding or rebound.   Musculoskeletal:     Cervical back: Neck supple.   Skin:    General: Skin is dry.   Neurological:     General: No focal deficit present.     Mental Status: She is alert. Mental status is at baseline.     Motor: No weakness.     Gait: Gait normal.   Psychiatric:        Mood and Affect: Mood normal.        Behavior: Behavior normal.      UC Treatments / Results  Labs (all labs ordered are listed, but only abnormal results are displayed) Labs Reviewed  PREGNANCY, URINE - Abnormal; Notable for the following components:      Result Value   Preg Test, Ur POSITIVE (*)    All other components within normal limits  URINALYSIS, DIPSTICK ONLY - Abnormal; Notable for the following components:   APPearance TURBID (*)    Hgb urine dipstick MODERATE (*)    Bilirubin Urine SMALL (*)    Ketones, ur >160 (*)    Protein, ur >300 (*)    Leukocytes,Ua LARGE (*)    All other components within normal limits  BASIC METABOLIC PANEL WITH GFR - Abnormal; Notable for the following components:   Sodium 132 (*)    Chloride 97 (*)    All other components within normal limits  URINE CULTURE  CERVICOVAGINAL ANCILLARY ONLY    EKG   Radiology No results  found.  Procedures Procedures (including critical care time)  Medications Ordered in UC Medications  ondansetron  (ZOFRAN -ODT) disintegrating tablet 4 mg (4 mg Oral Given 07/18/23 1428)    Initial Impression / Assessment and Plan / UC Course  I have reviewed the triage vital signs and the nursing notes.  Pertinent labs & imaging results that were available during my care of the patient were reviewed by me and considered in my medical decision making (see chart for details).   28 y/o female presents for -4 day history of nausea and vomiting. Positive pregnancy test last week. LMP 05/06/2023. Began having diarrhea today. No fever, abdominal pain, urinary symptoms or URI symptoms. History of substance abuse. Continues to smoke marijuana. Denies smoking cigarettes in the past 4 days and says she has not had any other drugs in several months.   Vitals stable and normal. Overall well appearing. Chest clear and heart RRR. Abdomen soft with mild TTP suprapubic region.  UA and urine pregnancy test obtained.   Positive pregnancy.  Ordered cervicovaginal swab which patient self obtained to test for GC/chlamydia/trich/BV/yeast  Patient given 4 mg ODT Zofran  in clinic for nausea/vomiting. Sent diclegis to pharmacy.  Urine dip shows turbid urine with moderate hgb, small bili,>160 ketones, >  300 protein, and large leuks. Urine sent for culture.   BMP ordered to assess kidney function given protein in urine. Normal creatinine.   Treating at this time for UTI with cefdinir. Will amend treatment based on culture if necessary and may add more meds if positive results on swab. Encouraged increasing rest and fluids. Patient says she has appointment with [facility] in 2 days.   Advised no smoking or drugs use in pregnancy and avoid NSAIDs.  Final Clinical Impressions(s) / UC Diagnoses   Final diagnoses:  Acute cystitis with hematuria  Pregnancy test positive  Nausea and vomiting, unspecified  vomiting type  Routine screening for STI (sexually transmitted infection)     Discharge Instructions      UTI: Based on either symptoms or urinalysis, you may have a urinary tract infection. We will send the urine for culture and call with results in a few days. Begin antibiotics at this time. Your symptoms should be much improved over the next 2-3 days. Increase rest and fluid intake. If for some reason symptoms are worsening or not improving after a couple of days or the urine culture determines the antibiotics you are taking will not treat the infection, the antibiotics may be changed. Return or go to ER for fever, back pain, worsening urinary pain, discharge, increased blood in urine. May take Tylenol  for pain relief   -Pending vaginal swab results. We will call in a few days if you need any other meds or changes to the ones you have been prescribed today  -Referral put in for OB/GYN. If problems with referral request one through primary doctor/provider.      ED Prescriptions     Medication Sig Dispense Auth. Provider   Doxylamine-Pyridoxine (DICLEGIS) 10-10 MG TBEC Take 2 tabs PO at bed time x 2 days. Increase to 2 tabs in am and 1-2 tabs at bed time thereafter as needed for nausea/vomiting 60 tablet Arvis Huxley B, PA-C   cefdinir (OMNICEF) 300 MG capsule Take 1 capsule (300 mg total) by mouth 2 (two) times daily for 7 days. 14 capsule Arvis Huxley NOVAK, PA-C      PDMP not reviewed this encounter.   Arvis Huxley NOVAK, PA-C 07/18/23 1555

## 2023-07-18 NOTE — ED Triage Notes (Addendum)
 Emesis and nausea x 5 days  Diarrhea started this morning   Positive preg test lats week

## 2023-07-19 LAB — URINE CULTURE: Culture: NO GROWTH

## 2023-07-20 ENCOUNTER — Ambulatory Visit (HOSPITAL_COMMUNITY): Payer: Self-pay

## 2023-07-20 LAB — CERVICOVAGINAL ANCILLARY ONLY
Bacterial Vaginitis (gardnerella): POSITIVE — AB
Candida Glabrata: NEGATIVE
Candida Vaginitis: NEGATIVE
Chlamydia: POSITIVE — AB
Comment: NEGATIVE
Comment: NEGATIVE
Comment: NEGATIVE
Comment: NEGATIVE
Comment: NEGATIVE
Comment: NORMAL
Neisseria Gonorrhea: NEGATIVE
Trichomonas: NEGATIVE

## 2023-07-20 MED ORDER — DOXYCYCLINE HYCLATE 100 MG PO TABS: 100.0000 mg | ORAL_TABLET | Freq: Two times a day (BID) | ORAL | 0 refills | Status: AC

## 2023-07-20 MED ORDER — METRONIDAZOLE 0.75 % VA GEL: 1.0000 | Freq: Every day | VAGINAL | 0 refills | Status: DC

## 2023-07-20 MED ORDER — DOXYLAMINE-PYRIDOXINE 10-10 MG PO TBEC: DELAYED_RELEASE_TABLET | ORAL | 1 refills | Status: DC

## 2023-07-20 MED ORDER — DOXYCYCLINE HYCLATE 100 MG PO TABS
100.0000 mg | ORAL_TABLET | Freq: Two times a day (BID) | ORAL | 0 refills | Status: DC
Start: 2023-07-20 — End: 2023-07-20

## 2023-07-20 MED ORDER — METRONIDAZOLE 0.75 % VA GEL: 1.0000 | Freq: Every day | VAGINAL | 0 refills | Status: AC

## 2023-07-20 NOTE — Telephone Encounter (Signed)
 Pt states [facility] was out of medication. Requests it be sent to [facility].

## 2023-07-30 ENCOUNTER — Other Ambulatory Visit: Payer: Self-pay

## 2023-07-30 DIAGNOSIS — O211 Hyperemesis gravidarum with metabolic disturbance: Secondary | ICD-10-CM | POA: Insufficient documentation

## 2023-07-30 DIAGNOSIS — O2341 Unspecified infection of urinary tract in pregnancy, first trimester: Secondary | ICD-10-CM | POA: Diagnosis not present

## 2023-07-30 DIAGNOSIS — F17210 Nicotine dependence, cigarettes, uncomplicated: Secondary | ICD-10-CM | POA: Diagnosis not present

## 2023-07-30 DIAGNOSIS — Z3A08 8 weeks gestation of pregnancy: Secondary | ICD-10-CM | POA: Insufficient documentation

## 2023-07-30 DIAGNOSIS — O34219 Maternal care for unspecified type scar from previous cesarean delivery: Secondary | ICD-10-CM | POA: Diagnosis not present

## 2023-07-30 DIAGNOSIS — Z6832 Body mass index (BMI) 32.0-32.9, adult: Secondary | ICD-10-CM | POA: Diagnosis not present

## 2023-07-30 DIAGNOSIS — E669 Obesity, unspecified: Secondary | ICD-10-CM | POA: Diagnosis not present

## 2023-07-30 DIAGNOSIS — O99211 Obesity complicating pregnancy, first trimester: Secondary | ICD-10-CM | POA: Insufficient documentation

## 2023-07-30 DIAGNOSIS — O23591 Infection of other part of genital tract in pregnancy, first trimester: Secondary | ICD-10-CM | POA: Diagnosis not present

## 2023-07-30 DIAGNOSIS — O99111 Other diseases of the blood and blood-forming organs and certain disorders involving the immune mechanism complicating pregnancy, first trimester: Secondary | ICD-10-CM | POA: Diagnosis not present

## 2023-07-30 DIAGNOSIS — Z8659 Personal history of other mental and behavioral disorders: Secondary | ICD-10-CM | POA: Insufficient documentation

## 2023-07-30 DIAGNOSIS — D573 Sickle-cell trait: Secondary | ICD-10-CM | POA: Diagnosis not present

## 2023-07-30 DIAGNOSIS — Z79899 Other long term (current) drug therapy: Secondary | ICD-10-CM | POA: Diagnosis not present

## 2023-07-30 DIAGNOSIS — R102 Pelvic and perineal pain: Secondary | ICD-10-CM | POA: Diagnosis not present

## 2023-07-30 DIAGNOSIS — O26891 Other specified pregnancy related conditions, first trimester: Principal | ICD-10-CM | POA: Insufficient documentation

## 2023-07-30 LAB — CBC
HCT: 37.7 % (ref 36.0–46.0)
Hemoglobin: 13.1 g/dL (ref 12.0–15.0)
MCH: 28.5 pg (ref 26.0–34.0)
MCHC: 34.7 g/dL (ref 30.0–36.0)
MCV: 82.1 fL (ref 80.0–100.0)
Platelets: 419 K/uL — ABNORMAL HIGH (ref 150–400)
RBC: 4.59 MIL/uL (ref 3.87–5.11)
RDW: 12.7 % (ref 11.5–15.5)
WBC: 6.7 K/uL (ref 4.0–10.5)
nRBC: 0 % (ref 0.0–0.2)

## 2023-07-30 NOTE — ED Triage Notes (Signed)
 Pt presents via EMS from home c/o lower abd pain with N/V. 9wks OB per EMS. Last vomited at 2222, EMS reports it was dark red in color. VSS. A&O x4. Ambulatory.

## 2023-07-31 ENCOUNTER — Emergency Department

## 2023-07-31 ENCOUNTER — Observation Stay
Admission: EM | Admit: 2023-07-31 | Discharge: 2023-08-01 | Disposition: A | Discharge status: Home / Self Care | Attending: Certified Nurse Midwife | Admitting: Certified Nurse Midwife

## 2023-07-31 ENCOUNTER — Encounter: Payer: Self-pay | Admitting: Obstetrics and Gynecology

## 2023-07-31 DIAGNOSIS — F1291 Cannabis use, unspecified, in remission: Secondary | ICD-10-CM | POA: Diagnosis present

## 2023-07-31 DIAGNOSIS — D573 Sickle-cell trait: Secondary | ICD-10-CM | POA: Diagnosis present

## 2023-07-31 DIAGNOSIS — N390 Urinary tract infection, site not specified: Secondary | ICD-10-CM

## 2023-07-31 DIAGNOSIS — R102 Pelvic and perineal pain: Secondary | ICD-10-CM

## 2023-07-31 DIAGNOSIS — B9689 Other specified bacterial agents as the cause of diseases classified elsewhere: Secondary | ICD-10-CM | POA: Diagnosis present

## 2023-07-31 DIAGNOSIS — O2340 Unspecified infection of urinary tract in pregnancy, unspecified trimester: Secondary | ICD-10-CM | POA: Diagnosis present

## 2023-07-31 DIAGNOSIS — O210 Mild hyperemesis gravidarum: Principal | ICD-10-CM | POA: Diagnosis present

## 2023-07-31 DIAGNOSIS — N760 Acute vaginitis: Secondary | ICD-10-CM

## 2023-07-31 DIAGNOSIS — O34219 Maternal care for unspecified type scar from previous cesarean delivery: Secondary | ICD-10-CM | POA: Diagnosis present

## 2023-07-31 DIAGNOSIS — O211 Hyperemesis gravidarum with metabolic disturbance: Secondary | ICD-10-CM | POA: Diagnosis present

## 2023-07-31 DIAGNOSIS — O26891 Other specified pregnancy related conditions, first trimester: Secondary | ICD-10-CM

## 2023-07-31 LAB — URINALYSIS, ROUTINE W REFLEX MICROSCOPIC
Bilirubin Urine: NEGATIVE
Glucose, UA: NEGATIVE mg/dL
Ketones, ur: 80 mg/dL — AB
Nitrite: NEGATIVE
Protein, ur: 30 mg/dL — AB
Specific Gravity, Urine: 1.02 (ref 1.005–1.030)
WBC, UA: >50 WBC/hpf (ref 0–5)
pH: 5 (ref 5.0–8.0)

## 2023-07-31 LAB — WET PREP, GENITAL
Sperm: NONE SEEN
Trich, Wet Prep: NONE SEEN
WBC, Wet Prep HPF POC: <10 (ref <10)
Yeast Wet Prep HPF POC: NONE SEEN

## 2023-07-31 LAB — COMPREHENSIVE METABOLIC PANEL WITH GFR
ALT: 26 U/L (ref 0–44)
ALT: 20 U/L (ref 0–44)
AST: 17 U/L (ref 15–41)
AST: 14 U/L — ABNORMAL LOW (ref 15–41)
Albumin: 3.7 g/dL (ref 3.5–5.0)
Albumin: 2.9 g/dL — ABNORMAL LOW (ref 3.5–5.0)
Alkaline Phosphatase: 63 U/L (ref 38–126)
Alkaline Phosphatase: 46 U/L (ref 38–126)
Anion gap: 14 (ref 5–15)
Anion gap: 8 (ref 5–15)
BUN: 7 mg/dL (ref 6–20)
BUN: 6 mg/dL (ref 6–20)
CO2: 22 mmol/L (ref 22–32)
CO2: 21 mmol/L — ABNORMAL LOW (ref 22–32)
Calcium: 9.5 mg/dL (ref 8.9–10.3)
Calcium: 8.5 mg/dL — ABNORMAL LOW (ref 8.9–10.3)
Chloride: 100 mmol/L (ref 98–111)
Chloride: 108 mmol/L (ref 98–111)
Creatinine, Ser: 0.71 mg/dL (ref 0.44–1.00)
Creatinine, Ser: 0.64 mg/dL (ref 0.44–1.00)
GFR, Estimated: >60 mL/min (ref >60)
GFR, Estimated: >60 mL/min (ref >60)
Glucose, Bld: 77 mg/dL (ref 70–99)
Glucose, Bld: 88 mg/dL (ref 70–99)
Potassium: 3.5 mmol/L (ref 3.5–5.1)
Potassium: 3.3 mmol/L — ABNORMAL LOW (ref 3.5–5.1)
Sodium: 136 mmol/L (ref 135–145)
Sodium: 137 mmol/L (ref 135–145)
Total Bilirubin: 0.8 mg/dL (ref 0.0–1.2)
Total Bilirubin: 0.9 mg/dL (ref 0.0–1.2)
Total Protein: 7.2 g/dL (ref 6.5–8.1)
Total Protein: 5.8 g/dL — ABNORMAL LOW (ref 6.5–8.1)

## 2023-07-31 LAB — CHLAMYDIA/NGC RT PCR (ARMC ONLY)
Chlamydia Tr: NOT DETECTED
N gonorrhoeae: NOT DETECTED

## 2023-07-31 LAB — URINE DRUG SCREEN, QUALITATIVE (ARMC ONLY)
Amphetamines, Ur Screen: NOT DETECTED
Barbiturates, Ur Screen: NOT DETECTED
Benzodiazepine, Ur Scrn: NOT DETECTED
Cannabinoid 50 Ng, Ur ~~LOC~~: POSITIVE — AB
Cocaine Metabolite,Ur ~~LOC~~: NOT DETECTED
MDMA (Ecstasy)Ur Screen: NOT DETECTED
Methadone Scn, Ur: NOT DETECTED
Opiate, Ur Screen: NOT DETECTED
Phencyclidine (PCP) Ur S: NOT DETECTED
Tricyclic, Ur Screen: NOT DETECTED

## 2023-07-31 LAB — HEMOGLOBIN AND HEMATOCRIT, BLOOD
HCT: 30.2 % — ABNORMAL LOW (ref 36.0–46.0)
Hemoglobin: 10.8 g/dL — ABNORMAL LOW (ref 12.0–15.0)

## 2023-07-31 LAB — CBC
MCH: 29.1 pg (ref 26.0–34.0)
MCV: 85.5 fL (ref 80.0–100.0)
Platelets: 138 K/uL — ABNORMAL LOW (ref 150–400)
RDW: 12.8 % (ref 11.5–15.5)
WBC: 2.5 K/uL — ABNORMAL LOW (ref 4.0–10.5)
nRBC: 0 % (ref 0.0–0.2)

## 2023-07-31 LAB — TSH: TSH: 0.122 u[IU]/mL — ABNORMAL LOW (ref 0.350–4.500)

## 2023-07-31 LAB — LIPASE, BLOOD: Lipase: 28 U/L (ref 11–51)

## 2023-07-31 LAB — MAGNESIUM: Magnesium: 1.8 mg/dL (ref 1.7–2.4)

## 2023-07-31 LAB — HCG, QUANTITATIVE, PREGNANCY: hCG, Beta Chain, Quant, S: 228513 m[IU]/mL — ABNORMAL HIGH (ref <5)

## 2023-07-31 MED ORDER — POTASSIUM CHLORIDE 10 MEQ/100ML IV SOLN
10.0000 meq | INTRAVENOUS | Status: AC
  Administered 2023-08-01 (×2): 10 meq via INTRAVENOUS
  Filled 2023-07-31 (×2): qty 100

## 2023-07-31 MED ORDER — METOCLOPRAMIDE HCL 10 MG PO TABS
10.0000 mg | ORAL_TABLET | Freq: Four times a day (QID) | ORAL | Status: DC
  Administered 2023-08-01 (×2): 10 mg via ORAL
  Filled 2023-07-31 (×2): qty 1

## 2023-07-31 MED ORDER — ACETAMINOPHEN 500 MG PO TABS: 1000.0000 mg | ORAL_TABLET | Freq: Four times a day (QID) | ORAL | Status: DC

## 2023-07-31 MED ORDER — FAMOTIDINE 20 MG PO TABS
20.0000 mg | ORAL_TABLET | Freq: Every day | ORAL | Status: DC
  Administered 2023-08-01: 20 mg via ORAL
  Filled 2023-07-31: qty 1

## 2023-07-31 MED ORDER — SODIUM CHLORIDE 0.9 % IV BOLUS (SEPSIS)
1000.0000 mL | Freq: Once | INTRAVENOUS | Status: AC
  Administered 2023-07-31: 1000 mL via INTRAVENOUS

## 2023-07-31 MED ORDER — METRONIDAZOLE 500 MG PO TABS
500.0000 mg | ORAL_TABLET | Freq: Once | ORAL | Status: AC
  Administered 2023-07-31: 500 mg via ORAL
  Filled 2023-07-31: qty 1

## 2023-07-31 MED ORDER — FAMOTIDINE IN NACL 20-0.9 MG/50ML-% IV SOLN
20.0000 mg | Freq: Every day | INTRAVENOUS | Status: DC
  Administered 2023-07-31: 20 mg via INTRAVENOUS
  Filled 2023-07-31 (×2): qty 50

## 2023-07-31 MED ORDER — ACETAMINOPHEN 10 MG/ML IV SOLN
1000.0000 mg | Freq: Once | INTRAVENOUS | Status: AC
  Administered 2023-07-31: 1000 mg via INTRAVENOUS
  Filled 2023-07-31 (×2): qty 100

## 2023-07-31 MED ORDER — METOCLOPRAMIDE HCL 5 MG/ML IJ SOLN
10.0000 mg | Freq: Four times a day (QID) | INTRAMUSCULAR | Status: DC
  Administered 2023-07-31 – 2023-08-01 (×4): 10 mg via INTRAVENOUS
  Filled 2023-07-31 (×4): qty 2

## 2023-07-31 MED ORDER — SODIUM CHLORIDE 0.9 % IV SOLN
500.0000 mg | Freq: Once | INTRAVENOUS | Status: AC
  Administered 2023-07-31: 500 mg via INTRAVENOUS
  Filled 2023-07-31: qty 5

## 2023-07-31 MED ORDER — ONDANSETRON HCL 4 MG/2ML IJ SOLN: 4.0000 mg | Freq: Three times a day (TID) | INTRAMUSCULAR | Status: DC | PRN

## 2023-07-31 MED ORDER — METRONIDAZOLE 500 MG PO TABS: 500.0000 mg | ORAL_TABLET | Freq: Two times a day (BID) | ORAL | 0 refills | Status: DC

## 2023-07-31 MED ORDER — PIPERACILLIN-TAZOBACTAM 3.375 G IVPB
3.3750 g | Freq: Three times a day (TID) | INTRAVENOUS | Status: DC
  Administered 2023-07-31 – 2023-08-01 (×4): 3.375 g via INTRAVENOUS
  Filled 2023-07-31 (×6): qty 50

## 2023-07-31 MED ORDER — ACETAMINOPHEN 500 MG PO TABS: 1000.0000 mg | ORAL_TABLET | Freq: Four times a day (QID) | ORAL | Status: DC | PRN

## 2023-07-31 MED ORDER — ONDANSETRON 4 MG PO TBDP: 4.0000 mg | ORAL_TABLET | Freq: Three times a day (TID) | ORAL | Status: DC | PRN

## 2023-07-31 MED ORDER — THIAMINE HCL 100 MG/ML IJ SOLN
Freq: Every day | INTRAVENOUS | Status: DC
  Filled 2023-07-31 (×2): qty 1000

## 2023-07-31 MED ORDER — ONDANSETRON 4 MG PO TBDP
4.0000 mg | ORAL_TABLET | Freq: Four times a day (QID) | ORAL | 0 refills | Status: DC | PRN
Start: 2023-07-31 — End: 2023-08-01

## 2023-07-31 MED ORDER — SODIUM CHLORIDE 0.9 % IV SOLN: INTRAVENOUS | Status: AC | PRN

## 2023-07-31 MED ORDER — ACETAMINOPHEN 500 MG PO TABS: 1000.0000 mg | ORAL_TABLET | Freq: Four times a day (QID) | ORAL | Status: AC | PRN

## 2023-07-31 MED ORDER — PHENAZOPYRIDINE HCL 95 MG PO TABS
95.0000 mg | ORAL_TABLET | Freq: Three times a day (TID) | ORAL | 0 refills | Status: DC | PRN
Start: 2023-07-31 — End: 2023-08-01

## 2023-07-31 MED ORDER — MORPHINE SULFATE (PF) 2 MG/ML IV SOLN
2.0000 mg | INTRAVENOUS | Status: DC | PRN
  Administered 2023-07-31 – 2023-08-01 (×2): 2 mg via INTRAVENOUS
  Filled 2023-07-31 (×2): qty 1

## 2023-07-31 MED ORDER — CEPHALEXIN 500 MG PO CAPS: 500.0000 mg | ORAL_CAPSULE | Freq: Two times a day (BID) | ORAL | 0 refills | Status: DC

## 2023-07-31 MED ORDER — DEXTROSE-SODIUM CHLORIDE 5-0.45 % IV SOLN: INTRAVENOUS | Status: AC

## 2023-07-31 MED ORDER — DICYCLOMINE HCL 10 MG PO CAPS
20.0000 mg | ORAL_CAPSULE | Freq: Once | ORAL | Status: AC
  Administered 2023-07-31: 20 mg via ORAL
  Filled 2023-07-31: qty 2

## 2023-07-31 MED ORDER — METRONIDAZOLE 500 MG/100ML IV SOLN
500.0000 mg | Freq: Two times a day (BID) | INTRAVENOUS | Status: DC
  Administered 2023-07-31 – 2023-08-01 (×3): 500 mg via INTRAVENOUS
  Filled 2023-07-31 (×3): qty 100

## 2023-07-31 MED ORDER — ACETAMINOPHEN 500 MG PO TABS
1000.0000 mg | ORAL_TABLET | Freq: Once | ORAL | Status: DC
  Filled 2023-07-31: qty 2

## 2023-07-31 MED ORDER — METOCLOPRAMIDE HCL 5 MG/ML IJ SOLN
10.0000 mg | Freq: Once | INTRAMUSCULAR | Status: AC
  Administered 2023-07-31: 10 mg via INTRAVENOUS
  Filled 2023-07-31: qty 2

## 2023-07-31 MED ORDER — ACETAMINOPHEN 10 MG/ML IV SOLN
1000.0000 mg | Freq: Four times a day (QID) | INTRAVENOUS | Status: AC | PRN
  Administered 2023-07-31 – 2023-08-01 (×4): 1000 mg via INTRAVENOUS
  Filled 2023-07-31 (×5): qty 100

## 2023-07-31 MED ORDER — SODIUM CHLORIDE 0.9 % IV SOLN
2.0000 g | Freq: Once | INTRAVENOUS | Status: AC
  Administered 2023-07-31: 2 g via INTRAVENOUS
  Filled 2023-07-31: qty 20

## 2023-07-31 MED ORDER — ONDANSETRON HCL 4 MG/2ML IJ SOLN
4.0000 mg | Freq: Once | INTRAMUSCULAR | Status: AC
  Administered 2023-07-31: 4 mg via INTRAVENOUS
  Filled 2023-07-31: qty 2

## 2023-07-31 MED ORDER — PHENAZOPYRIDINE HCL 100 MG PO TABS
95.0000 mg | ORAL_TABLET | Freq: Once | ORAL | Status: AC
  Administered 2023-07-31: 100 mg via ORAL
  Filled 2023-07-31: qty 1

## 2023-07-31 NOTE — Plan of Care (Signed)
  Problem: Education: Goal: Knowledge of disease or condition will improve Outcome: Progressing Goal: Knowledge of the prescribed therapeutic regimen will improve Outcome: Progressing   Problem: Bowel/Gastric: Goal: Occurences of nausea and/or vomiting will decrease Outcome: Progressing   Problem: Fluid Volume: Goal: Maintenance of adequate hydration will improve Outcome: Progressing   Problem: Nutritional: Goal: Achievement of adequate weight for body size and type will improve Outcome: Progressing   Problem: Health Behavior/Discharge Planning: Goal: Ability to manage health-related needs will improve Outcome: Progressing   Problem: Clinical Measurements: Goal: Ability to maintain clinical measurements within normal limits will improve Outcome: Progressing Goal: Will remain free from infection Outcome: Progressing Goal: Diagnostic test results will improve Outcome: Progressing Goal: Respiratory complications will improve Outcome: Progressing Goal: Cardiovascular complication will be avoided Outcome: Progressing   Problem: Activity: Goal: Risk for activity intolerance will decrease Outcome: Progressing   Problem: Nutrition: Goal: Adequate nutrition will be maintained Outcome: Progressing   Problem: Coping: Goal: Level of anxiety will decrease Outcome: Progressing   Problem: Elimination: Goal: Will not experience complications related to bowel motility Outcome: Progressing Goal: Will not experience complications related to urinary retention Outcome: Progressing   Problem: Safety: Goal: Ability to remain free from injury will improve Outcome: Progressing   Problem: Skin Integrity: Goal: Risk for impaired skin integrity will decrease Outcome: Progressing

## 2023-07-31 NOTE — Discharge Instructions (Addendum)
 You have an infection called bacterial vaginosis today and a urinary tract infection.  These are not sexually transmitted diseases.  Please take both of your antibiotics twice a day for the next week.  You may take over-the-counter Tylenol  1000 mg every 6 hours as needed for pain.  Please increase your fluid intake, rest.  We have given you a prescription of Azo to take as needed for lower abdominal pain as well given your UTI.  If you continue to have pelvic pain, I recommend close follow-up with your OB/GYN.  Your labs, OB ultrasound today were normal.  Your gonorrhea and Chlamydia test were negative.  I suspect that the blood that you saw in your vomit today was likely due to irritation of your esophagus or something called a Mallory-Weiss tear that can occur from frequent episodes of vomiting.  There is no sign of any active bleeding currently and your vital signs have been normal and your hemoglobin was normal.  I recommend bland diet for the next 2 days and increase water intake.

## 2023-07-31 NOTE — ED Notes (Signed)
 EDP consulted regarding Abx warning for rocephin , Clinician reviewed and deemed safe.

## 2023-07-31 NOTE — H&P (Signed)
 HISTORY AND PHYSICAL NOTE  History of Present Illness: Maureen Cox is a 28 y.o. G2P1001 at [redacted]w[redacted]d, EDD 03/08/2023 by US , admitted for intractable nausea and vomiting in pregnancy.  She presented to the ED with complaints of lower abdominal pain and recurrent vomiting.  She reports multiple emesis episodes per day over the past month. Pranavi saw streaks of dark red blood during her last episode. She denies diarrhea or blood in stools. Denies dysuria, hematuria, vaginal bleeding, vaginal discharge, or LOF. She was recently diagnosed with chlamydia (07/2023) and was treated. Repeat GC/CT screen was negative today. She has tried diclegis  and reglan  to help with N/V but has not been effective.  She denies cramping, vaginal bleeding, or LOF.  Has not yet been able to feel fetal movement yet.   Factors complicating pregnancy:  Principal Problem:   Hyperemesis gravidarum Active Problems:   History of marijuana use   Sickle cell trait (HCC)   Bacterial vaginosis in pregnancy   UTI (urinary tract infection) during pregnancy   Previous cesarean delivery affecting pregnancy    Prenatal care site:  [facility]  Patient Active Problem List   Diagnosis Date Noted   Hyperemesis gravidarum 07/31/2023   Bacterial vaginosis in pregnancy 07/31/2023   UTI (urinary tract infection) during pregnancy 07/31/2023   Previous cesarean delivery affecting pregnancy 07/31/2023   Tobacco use 09/16/2019   Vaginal mass 09/13/2019   Sickle cell trait (HCC) 04/18/2019   Morbid obesity (HCC) 210 lbs 11/12/2018   PTSD (post-traumatic stress disorder) 2020 11/12/2018   History of marijuana use 11/12/2018    Past Medical History:  Diagnosis Date   Depression    PTSD (post-traumatic stress disorder)     Past Surgical History:  Procedure Laterality Date   CESAREAN SECTION  10/08/2019   Procedure: CESAREAN SECTION;  Surgeon: Schermerhorn, Debby PARAS, MD;  Location: [facility] ORS;  Service: Obstetrics;;    OB  History  Gravida Para Term Preterm AB Living  2 1 1   1   SAB IAB Ectopic Multiple Live Births      1    # Outcome Date GA Lbr Len/2nd Weight Sex Type Anes PTL Lv  2 Current           1 Term 10/08/19 [redacted]w[redacted]d  2870 g F CS-LTranv EPI  LIV    Social History:  reports that she has been smoking cigarettes. She has never used smokeless tobacco. She reports that she does not currently use alcohol after a past usage of about 12.0 standard drinks of alcohol per week. She reports that she does not currently use drugs after having used the following drugs: Marijuana.  Family History: family history is not on file.  Allergies  Allergen Reactions   Rocephin  [Ceftriaxone ] Rash    Medications Prior to Admission  Medication Sig Dispense Refill Last Dose/Taking   acetaminophen  (TYLENOL ) 500 MG tablet Take 2 tablets (1,000 mg total) by mouth every 6 (six) hours. (Patient not taking: Reported on 07/31/2023) 30 tablet 0 Not Taking   albuterol  (VENTOLIN  HFA) 108 (90 Base) MCG/ACT inhaler Inhale 2 puffs into the lungs every 6 (six) hours as needed for wheezing or shortness of breath. (Patient not taking: Reported on 07/31/2023) 8 g 2 Not Taking   dicyclomine  (BENTYL ) 10 MG capsule Take 1 capsule (10 mg total) by mouth 3 (three) times daily before meals for 10 days. (Patient not taking: Reported on 07/31/2023) 30 capsule 0 Not Taking   Doxylamine -Pyridoxine  (DICLEGIS ) 10-10 MG TBEC Take  2 tabs PO at bed time x 2 days. Increase to 2 tabs in am and 1-2 tabs at bed time thereafter as needed for nausea/vomiting (Patient not taking: Reported on 07/31/2023) 60 tablet 1 Not Taking   famotidine  (PEPCID ) 20 MG tablet Take 1 tablet (20 mg total) by mouth 2 (two) times daily for 20 days. (Patient not taking: Reported on 07/31/2023) 40 tablet 0 Not Taking   metoCLOPramide  (REGLAN ) 10 MG tablet Take 1 tablet (10 mg total) by mouth 3 (three) times daily with meals for 10 days. (Patient not taking: Reported on 07/31/2023) 30 tablet 0  Not Taking    Review of Systems  Constitutional:  Negative for chills and fever.  Respiratory:  Negative for cough and shortness of breath.   Cardiovascular:  Negative for chest pain.  Gastrointestinal:  Positive for abdominal pain, nausea and vomiting. Negative for blood in stool, diarrhea and heartburn.  Genitourinary:  Negative for dysuria, frequency and urgency.  Musculoskeletal:  Negative for back pain.  Neurological:  Negative for dizziness.  Psychiatric/Behavioral:  Negative for depression. The patient is not nervous/anxious.     Physical Examination: Vitals:  BP 104/63 (BP Location: Left Arm)   Pulse 63   Temp 98.8 F (37.1 C) (Oral)   Resp 18   Ht 5' 9 (1.753 m)   Wt 100.8 kg   LMP 05/06/2023 (Exact Date)   SpO2 97%   BMI 32.82 kg/m  General: no acute distress.  HEENT: normocephalic, atraumatic Heart: regular rate & rhythm.   Lungs: normal respiratory effort Abdomen: soft, gravid, c/w with [redacted] week gestation, non-tender  Pelvic: deferred  Extremities: non-tender, symmetric, no edema bilaterally.  DTRs: 2+/2+  Neurologic: Alert & oriented x 3.    Labs:  Results for orders placed or performed during the hospital encounter of 07/31/23 (from the past 24 hours)  Lipase, blood   Collection Time: 07/30/23 11:26 PM  Result Value Ref Range   Lipase 28 11 - 51 U/L  Comprehensive metabolic panel   Collection Time: 07/30/23 11:26 PM  Result Value Ref Range   Sodium 136 135 - 145 mmol/L   Potassium 3.5 3.5 - 5.1 mmol/L   Chloride 100 98 - 111 mmol/L   CO2 22 22 - 32 mmol/L   Glucose, Bld 77 70 - 99 mg/dL   BUN 7 6 - 20 mg/dL   Creatinine, Ser 9.28 0.44 - 1.00 mg/dL   Calcium  9.5 8.9 - 10.3 mg/dL   Total Protein 7.2 6.5 - 8.1 g/dL   Albumin 3.7 3.5 - 5.0 g/dL   AST 17 15 - 41 U/L   ALT 26 0 - 44 U/L   Alkaline Phosphatase 63 38 - 126 U/L   Total Bilirubin 0.8 0.0 - 1.2 mg/dL   GFR, Estimated >39 >39 mL/min   Anion gap 14 5 - 15  CBC   Collection Time: 07/30/23  11:26 PM  Result Value Ref Range   WBC 6.7 4.0 - 10.5 K/uL   RBC 4.59 3.87 - 5.11 MIL/uL   Hemoglobin 13.1 12.0 - 15.0 g/dL   HCT 62.2 63.9 - 53.9 %   MCV 82.1 80.0 - 100.0 fL   MCH 28.5 26.0 - 34.0 pg   MCHC 34.7 30.0 - 36.0 g/dL   RDW 87.2 88.4 - 84.4 %   Platelets 419 (H) 150 - 400 K/uL   nRBC 0.0 0.0 - 0.2 %  hCG, quantitative, pregnancy   Collection Time: 07/30/23 11:26 PM  Result Value Ref Range  hCG, Beta ChainEarleen, GORMAN 228,513 (H) <5 mIU/mL  TSH   Collection Time: 07/30/23 11:26 PM  Result Value Ref Range   TSH 0.122 (L) 0.350 - 4.500 uIU/mL  Urine Drug Screen, Qualitative ([facility] only)   Collection Time: 07/31/23 12:23 AM  Result Value Ref Range   Tricyclic, Ur Screen NONE DETECTED NONE DETECTED   Amphetamines, Ur Screen NONE DETECTED NONE DETECTED   MDMA (Ecstasy)Ur Screen NONE DETECTED NONE DETECTED   Cocaine Metabolite,Ur [place] NONE DETECTED NONE DETECTED   Opiate, Ur Screen NONE DETECTED NONE DETECTED   Phencyclidine (PCP) Ur S NONE DETECTED NONE DETECTED   Cannabinoid 50 Ng, Ur [place] POSITIVE (A) NONE DETECTED   Barbiturates, Ur Screen NONE DETECTED NONE DETECTED   Benzodiazepine, Ur Scrn NONE DETECTED NONE DETECTED   Methadone Scn, Ur NONE DETECTED NONE DETECTED  Urinalysis, Routine w reflex microscopic -Urine, Clean Catch   Collection Time: 07/31/23 12:36 AM  Result Value Ref Range   Color, Urine YELLOW (A) YELLOW   APPearance CLOUDY (A) CLEAR   Specific Gravity, Urine 1.020 1.005 - 1.030   pH 5.0 5.0 - 8.0   Glucose, UA NEGATIVE NEGATIVE mg/dL   Hgb urine dipstick SMALL (A) NEGATIVE   Bilirubin Urine NEGATIVE NEGATIVE   Ketones, ur 80 (A) NEGATIVE mg/dL   Protein, ur 30 (A) NEGATIVE mg/dL   Nitrite NEGATIVE NEGATIVE   Leukocytes,Ua LARGE (A) NEGATIVE   RBC / HPF 6-10 0 - 5 RBC/hpf   WBC, UA >50 0 - 5 WBC/hpf   Bacteria, UA RARE (A) NONE SEEN   Squamous Epithelial / HPF 11-20 0 - 5 /HPF   WBC Clumps PRESENT    Mucus PRESENT   Chlamydia/NGC rt PCR ([facility]  only)   Collection Time: 07/31/23  1:30 AM   Specimen: Cervical/Vaginal swab  Result Value Ref Range   Specimen source GC/Chlam ENDOCERVICAL    Chlamydia Tr NOT DETECTED NOT DETECTED   N gonorrhoeae NOT DETECTED NOT DETECTED  Wet prep, genital   Collection Time: 07/31/23  1:30 AM   Specimen: Cervical/Vaginal swab  Result Value Ref Range   Yeast Wet Prep HPF POC NONE SEEN NONE SEEN   Trich, Wet Prep NONE SEEN NONE SEEN   Clue Cells Wet Prep HPF POC PRESENT (A) NONE SEEN   WBC, Wet Prep HPF POC <10 <10   Sperm NONE SEEN     Imaging Studies: US  OB LESS THAN 14 WEEKS W/ OB TRANSVAGINAL AND DOPPLER Result Date: 07/31/2023 CLINICAL DATA:  Known positive pregnancy with pelvic pain, initial encounter EXAM: OBSTETRIC <14 WK US  AND TRANSVAGINAL OB US  DOPPLER ULTRASOUND OF OVARIES TECHNIQUE: Both transabdominal and transvaginal ultrasound examinations were performed for complete evaluation of the gestation as well as the maternal uterus, adnexal regions, and pelvic cul-de-sac. Transvaginal technique was performed to assess early pregnancy. Color and duplex Doppler ultrasound was utilized to evaluate blood flow to the ovaries. COMPARISON:  None Available. FINDINGS: Intrauterine gestational sac: Present Yolk sac:  Present Embryo:  Present Cardiac Activity: Present Heart Rate: 169 bpm CRL:   20.2 mm   8 w 4 d                  US  EDC: 03/07/2024 Subchorionic hemorrhage:  None visualized. Maternal uterus/adnexae: Ovaries are within normal limits. A mildly complex cyst is noted adjacent to the vaginal canal measuring 4.4 x 2.0 x 5.3 cm. This was present on a prior exam from 2021. 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 Pulsed  Doppler evaluation of both ovaries demonstrates normal appearing low-resistance arterial and venous waveforms. IMPRESSION: Single live intrauterine gestation at 8 weeks 4 days. 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 This was noted on prior examination in 2021. Electronically  Signed   By: Oneil Devonshire M.D.   On: 07/31/2023 00:48    Assessment and Plan: Patient Active Problem List   Diagnosis Date Noted   Hyperemesis gravidarum 07/31/2023   Bacterial vaginosis in pregnancy 07/31/2023   UTI (urinary tract infection) during pregnancy 07/31/2023   Previous cesarean delivery affecting pregnancy 07/31/2023   Tobacco use 09/16/2019   Vaginal mass 09/13/2019   Sickle cell trait (HCC) 04/18/2019   Morbid obesity (HCC) 210 lbs 11/12/2018   PTSD (post-traumatic stress disorder) 2020 11/12/2018   History of marijuana use 11/12/2018    Admit to Antenatal - Admission for hyperemesis - Admission status: Observation - Dr WENDI Penton MD notified of admission and plan of care   Routine antenatal care  - Activity as tolerated  - Bathroom privileges  - OB US  completed - viable IUP seen c/w dates   Hyperemesis gravidarum  - hyperemesis protocol initiated  - NPO - advance diet as tolerated  - Maintain IV fluids and IV electrolyte replacement  - Scheduled IV/PO antiemetics ordered with PRN IV/PO/PR antiemetics for breakthrough N/V  - Daily weight  - Q4 hour intake/output   UTI pregnancy  - UA concerning for UTI  - Urine culture is pending  - Rocephin  IV was started for empiric treatment but she reported a rash during administration  - Zosyn  per pharmacy consult ordered until able to tolerate PO intake   Bacterial vaginosis in pregnancy  - Wet prep positive for BV  - IV flagyl  ordered  - Transition or PO flagyl  when tolerating PO intake   Recent chlamydia infection  - Positive for chlamydia on 07/18/2023  - Rx for azithromycin  was sent to pharmacy for treatment but pt reports she vomited the medications up 30 minutes after taking them  - GC/CT screen negative today  - Azith 500 mg x 1 dose ordered for prophylaxis   History of marijuana use  - Reports last use was 5 weeks ago  - Current UDS positive for [facility]  - Reviewed cannabinoid induced hyperemesis and  encouraged to continue to avoid THC  Therisa CHRISTELLA Pillow, CNM  Certified Nurse Midwife [facility]  [facility]

## 2023-07-31 NOTE — ED Notes (Addendum)
 Abx pause d/t Pt stating she feels itchy. EDP made aware, med on hold.

## 2023-07-31 NOTE — Progress Notes (Signed)
 Diet has been advanced from NPO to soft diet and has tolerated well without any emesis episodes. Appetite is good!

## 2023-07-31 NOTE — ED Notes (Signed)
 Pt ambulated to toilet to provide urine sample.

## 2023-07-31 NOTE — ED Provider Notes (Signed)
 [facility] Provider Note    Event Date/Time   First MD Initiated Contact with Patient 07/31/23 0033     (approximate)   History   Abdominal Pain (X 4 weeks)   HPI  Maureen Cox is a 28 y.o. female with history of marijuana use disorder, depression, PTSD, G2P1 approximately 12 weeks and 2 days pregnant based on LMP of 05/06/2023 (but patient reports she has been told multiple dates) who presents to the emergency department with complaints of lower abdominal pain for the past month and recurrent vomiting.  Patient states that she has vomited multiple times today and during her last episode she saw streaks of dark red blood.  She states this concerned her which is what prompted her to come to the emergency department.  No diarrhea, bloody stools or melena.  No fever.  No dysuria, hematuria, vaginal bleeding or discharge.  She was recently diagnosed with chlamydia and states she completed treatment.  Has had previous C-section.  No other abdominal surgeries.  She states that she has been taking Diclegis  and Reglan  at home without relief.   Patient is seen by Kathrine Phillips, NP at [facility].  History provided by patient.    Past Medical History:  Diagnosis Date   Depression    PTSD (post-traumatic stress disorder)     Past Surgical History:  Procedure Laterality Date   CESAREAN SECTION  10/08/2019   Procedure: CESAREAN SECTION;  Surgeon: Schermerhorn, Debby PARAS, MD;  Location: [facility] ORS;  Service: Obstetrics;;   NO PAST SURGERIES      MEDICATIONS:  Prior to Admission medications   Medication Sig Start Date End Date Taking? Authorizing Provider  acetaminophen  (TYLENOL ) 500 MG tablet Take 2 tablets (1,000 mg total) by mouth every 6 (six) hours. 10/10/19   McVey, Asberry LABOR, CNM  albuterol  (VENTOLIN  HFA) 108 (90 Base) MCG/ACT inhaler Inhale 2 puffs into the lungs every 6 (six) hours as needed for wheezing or shortness of breath. 01/13/20   Woods, Jaclyn  M, PA-C  dicyclomine  (BENTYL ) 10 MG capsule Take 1 capsule (10 mg total) by mouth 3 (three) times daily before meals for 10 days. 12/23/22 01/02/23  Menshew, Candida LULLA Kings, PA-C  Doxylamine -Pyridoxine  (DICLEGIS ) 10-10 MG TBEC Take 2 tabs PO at bed time x 2 days. Increase to 2 tabs in am and 1-2 tabs at bed time thereafter as needed for nausea/vomiting 07/20/23   Arvis Jolan NOVAK, PA-C  famotidine  (PEPCID ) 20 MG tablet Take 1 tablet (20 mg total) by mouth 2 (two) times daily for 20 days. 12/23/22 01/12/23  Menshew, Candida LULLA Kings, PA-C  ferrous sulfate  325 (65 FE) MG tablet Take 1 tablet (325 mg total) by mouth 2 (two) times daily with a meal. 10/10/19   McVey, Asberry LABOR, CNM  ibuprofen  (ADVIL ) 800 MG tablet Take 1 tablet (800 mg total) by mouth every 6 (six) hours. 10/10/19   McVey, Asberry LABOR, CNM  metoCLOPramide  (REGLAN ) 10 MG tablet Take 1 tablet (10 mg total) by mouth 3 (three) times daily with meals for 10 days. 12/23/22 01/02/23  Menshew, Candida LULLA Kings, PA-C  senna-docusate (SENOKOT-S) 8.6-50 MG tablet Take 2 tablets by mouth daily. 10/10/19   McVey, Asberry LABOR, CNM  simethicone  (MYLICON) 80 MG chewable tablet Chew 1 tablet (80 mg total) by mouth 3 (three) times daily after meals. 10/10/19   McVey, Asberry LABOR, CNM    Physical Exam   Triage Vital Signs: ED Triage Vitals  Encounter Vitals Group  BP 07/30/23 2326 137/75     Girls Systolic BP Percentile --      Girls Diastolic BP Percentile --      Boys Systolic BP Percentile --      Boys Diastolic BP Percentile --      Pulse Rate 07/30/23 2326 73     Resp 07/30/23 2326 16     Temp 07/30/23 2326 98.7 F (37.1 C)     Temp Source 07/30/23 2326 Oral     SpO2 07/30/23 2326 100 %     Weight --      Height 07/30/23 2327 5' 9 (1.753 m)     Head Circumference --      Peak Flow --      Pain Score 07/30/23 2326 8     Pain Loc --      Pain Education --      Exclude from Growth Chart --     Most recent vital signs: Vitals:   07/31/23 0300  07/31/23 0340  BP: 129/83   Pulse: (!) 53   Resp: 12   Temp:  98.1 F (36.7 C)  SpO2: 100%     CONSTITUTIONAL: Alert, responds appropriately to questions. Well-appearing; well-nourished HEAD: Normocephalic, atraumatic EYES: Conjunctivae clear, pupils appear equal, sclera nonicteric ENT: normal nose; moist mucous membranes NECK: Supple, normal ROM CARD: RRR; S1 and S2 appreciated RESP: Normal chest excursion without splinting or tachypnea; breath sounds clear and equal bilaterally; no wheezes, no rhonchi, no rales, no hypoxia or respiratory distress, speaking full sentences ABD/GI: Non-distended; soft, non-tender, no rebound, no guarding, no peritoneal signs, no tenderness at McBurney's point BACK: The back appears normal EXT: Normal ROM in all joints; no deformity noted, no edema SKIN: Normal color for age and race; warm; no rash on exposed skin NEURO: Moves all extremities equally, normal speech PSYCH: The patient's mood and manner are appropriate.   ED Results / Procedures / Treatments   LABS: (all labs ordered are listed, but only abnormal results are displayed) Labs Reviewed  WET PREP, GENITAL - Abnormal; Notable for the following components:      Result Value   Clue Cells Wet Prep HPF POC PRESENT (*)    All other components within normal limits  CBC - Abnormal; Notable for the following components:   Platelets 419 (*)    All other components within normal limits  URINALYSIS, ROUTINE W REFLEX MICROSCOPIC - Abnormal; Notable for the following components:   Color, Urine YELLOW (*)    APPearance CLOUDY (*)    Hgb urine dipstick SMALL (*)    Ketones, ur 80 (*)    Protein, ur 30 (*)    Leukocytes,Ua LARGE (*)    Bacteria, UA RARE (*)    All other components within normal limits  HCG, QUANTITATIVE, PREGNANCY - Abnormal; Notable for the following components:   hCG, Beta Chain, Quant, S 228,513 (*)    All other components within normal limits  CHLAMYDIA/NGC RT PCR ([facility]  ONLY)            URINE CULTURE  LIPASE, BLOOD  COMPREHENSIVE METABOLIC PANEL WITH GFR  URINE DRUG SCREEN, QUALITATIVE ([facility] ONLY)  CBC  COMPREHENSIVE METABOLIC PANEL WITH GFR  MAGNESIUM  TSH     EKG:   RADIOLOGY: My personal review and interpretation of imaging: Ultrasound shows single IUP measuring 8 weeks, 4 days with cardiac activity.  I have personally reviewed all radiology reports.   US  OB LESS THAN 14 WEEKS W/ OB TRANSVAGINAL  AND DOPPLER Result Date: 07/31/2023 CLINICAL DATA:  Known positive pregnancy with pelvic pain, initial encounter EXAM: OBSTETRIC <14 WK US  AND TRANSVAGINAL OB US  DOPPLER ULTRASOUND OF OVARIES TECHNIQUE: Both transabdominal and transvaginal ultrasound examinations were performed for complete evaluation of the gestation as well as the maternal uterus, adnexal regions, and pelvic cul-de-sac. Transvaginal technique was performed to assess early pregnancy. Color and duplex Doppler ultrasound was utilized to evaluate blood flow to the ovaries. COMPARISON:  None Available. FINDINGS: Intrauterine gestational sac: Present Yolk sac:  Present Embryo:  Present Cardiac Activity: Present Heart Rate: 169 bpm CRL:   20.2 mm   8 w 4 d                  US  EDC: 03/07/2024 Subchorionic hemorrhage:  None visualized. Maternal uterus/adnexae: Ovaries are within normal limits. A mildly complex cyst is noted adjacent to the vaginal canal measuring 4.4 x 2.0 x 5.3 cm. This was present on a prior exam from 2021. 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 Pulsed Doppler evaluation of both ovaries demonstrates normal appearing low-resistance arterial and venous waveforms. IMPRESSION: Single live intrauterine gestation at 8 weeks 4 days. 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 This was noted on prior examination in 2021. Electronically Signed   By: Oneil Devonshire M.D.   On: 07/31/2023 00:48     PROCEDURES:  Critical Care performed: No     Procedures    IMPRESSION / MDM /  ASSESSMENT AND PLAN / ED COURSE  I reviewed the triage vital signs and the nursing notes.    Patient here with hyperemesis gravidarum.  Reports seeing streaks of blood in her vomit.  Also recently treated for chlamydia.  Reports she has completed treatment and is not having discharge or bleeding.  Complaining of abdominal pain but abdominal exam here is benign.     DIFFERENTIAL DIAGNOSIS (includes but not limited to):   Hyperemesis gravidarum, dehydration, electrolyte derangement, ovarian cyst, ovarian torsion, PID, cervicitis, TOA, UTI, doubt appendicitis   Patient's presentation is most consistent with acute presentation with potential threat to life or bodily function.   PLAN: Will obtain labs, urine, transvaginal ultrasound with Doppler.  Will give IV fluids, Zofran , IV Tylenol .   MEDICATIONS GIVEN IN ED: Medications  acetaminophen  (TYLENOL ) tablet 1,000 mg (has no administration in time range)  famotidine  (PEPCID ) IVPB 20 mg premix (has no administration in time range)    Or  famotidine  (PEPCID ) tablet 20 mg (has no administration in time range)  lactated ringers  1,000 mL with thiamine  100 mg, folic acid 1 mg, M.V.I. Adult 10 mL infusion (has no administration in time range)  dextrose  5 % and 0.45 % NaCl infusion (has no administration in time range)  metoCLOPramide  (REGLAN ) tablet 10 mg (has no administration in time range)    Or  metoCLOPramide  (REGLAN ) injection 10 mg (has no administration in time range)  ondansetron  (ZOFRAN -ODT) disintegrating tablet 4 mg (has no administration in time range)    Or  ondansetron  (ZOFRAN ) injection 4 mg (has no administration in time range)  metroNIDAZOLE  (FLAGYL ) IVPB 500 mg (has no administration in time range)  sodium chloride  0.9 % bolus 1,000 mL (0 mLs Intravenous Stopped 07/31/23 0258)  ondansetron  (ZOFRAN ) injection 4 mg (4 mg Intravenous Given 07/31/23 0128)  acetaminophen  (OFIRMEV ) IV 1,000 mg (0 mg Intravenous Stopped 07/31/23 0224)   metroNIDAZOLE  (FLAGYL ) tablet 500 mg (500 mg Oral Given 07/31/23 0259)  cefTRIAXone  (ROCEPHIN ) 2 g in sodium chloride  0.9 %  100 mL IVPB (0 g Intravenous Stopped 07/31/23 0448)  phenazopyridine  (PYRIDIUM ) tablet 100 mg (100 mg Oral Given 07/31/23 0425)  dicyclomine  (BENTYL ) capsule 20 mg (20 mg Oral Given 07/31/23 0424)  metoCLOPramide  (REGLAN ) injection 10 mg (10 mg Intravenous Given 07/31/23 0447)  sodium chloride  0.9 % bolus 1,000 mL (1,000 mLs Intravenous New Bag/Given 07/31/23 0446)     ED COURSE: Patient's labs show hemoglobin of 13.  Normal electrolytes, creatinine and LFTs.  Gonorrhea and chlamydia negative today.  Wet prep positive for clue cells.  Will start her on Flagyl .  Urine pending.  Patient still complaining of discomfort but nausea has improved.  Tolerating p.o. currently.  Will give Azo, Bentyl .  Ultrasound reviewed and interpreted by myself and the radiologist and shows normal IUP measuring 8 weeks, 4 days with normal cardiac activity with no complicating factor.  Normal blood flow to both ovaries.   Patient's urine does appear infected.  Culture is pending.  Will give Rocephin .  Plan initially was to discharge patient home given she was feeling better but then nursing staff reports that patient vomited after taking oral medications.  Will give second liter of fluid, IV Reglan .  No blood appreciated in this episode of emesis.  Will discuss with OB/GYN for admission for hyperemesis gravidarum.  Urine does show large ketones suggestive of dehydration.   Nursing staff reports that patient complaining of itching and throat discomfort after getting part of her Rocephin .  We have paused the Rocephin  and will continue to closely monitor.  No hives on exam.  Hemodynamically stable.  No lip or tongue swelling.  Airway patent.   CONSULTS: Discussed with Therisa Pillow, certified nurse midwife on-call for [facility] clinic.  She will place orders for admission.   OUTSIDE RECORDS REVIEWED:  Reviewed last OB/GYN notes.       FINAL CLINICAL IMPRESSION(S) / ED DIAGNOSES   Final diagnoses:  Pelvic pain affecting pregnancy in first trimester, antepartum  Acute UTI  Bacterial vaginosis  Hyperemesis gravidarum     Rx / DC Orders   ED Discharge Orders          Ordered    ondansetron  (ZOFRAN -ODT) 4 MG disintegrating tablet  Every 6 hours PRN        07/31/23 0423    metroNIDAZOLE  (FLAGYL ) 500 MG tablet  2 times daily        07/31/23 0423    cephALEXin  (KEFLEX ) 500 MG capsule  2 times daily        07/31/23 0423    phenazopyridine  (PYRIDIUM ) 95 MG tablet  3 times daily PRN        07/31/23 0423             Note:  This document was prepared using Dragon voice recognition software and may include unintentional dictation errors.   Bubber Rothert, Josette SAILOR, DO 07/31/23 [phone]

## 2023-08-01 LAB — BASIC METABOLIC PANEL WITH GFR
Anion gap: 10 (ref 5–15)
BUN: 7 mg/dL (ref 6–20)
CO2: 19 mmol/L — ABNORMAL LOW (ref 22–32)
Calcium: 8.6 mg/dL — ABNORMAL LOW (ref 8.9–10.3)
Chloride: 107 mmol/L (ref 98–111)
Creatinine, Ser: 0.68 mg/dL (ref 0.44–1.00)
GFR, Estimated: >60 mL/min (ref >60)
Glucose, Bld: 88 mg/dL (ref 70–99)
Potassium: 3.6 mmol/L (ref 3.5–5.1)
Sodium: 136 mmol/L (ref 135–145)

## 2023-08-01 LAB — URINE CULTURE: Culture: <10000 — AB

## 2023-08-01 MED ORDER — ACETAMINOPHEN 500 MG PO TABS: 500.0000 mg | ORAL_TABLET | Freq: Four times a day (QID) | ORAL | Status: AC | PRN

## 2023-08-01 MED ORDER — ACETAMINOPHEN 325 MG PO TABS: 650.0000 mg | ORAL_TABLET | Freq: Four times a day (QID) | ORAL | Status: DC | PRN

## 2023-08-01 MED ORDER — POTASSIUM CHLORIDE 10 MEQ/100ML IV SOLN
10.0000 meq | Freq: Once | INTRAVENOUS | Status: DC
  Filled 2023-08-01: qty 100

## 2023-08-01 MED ORDER — FAMOTIDINE 20 MG PO TABS: 20.0000 mg | ORAL_TABLET | Freq: Every day | ORAL | 11 refills | Status: DC

## 2023-08-01 MED ORDER — METRONIDAZOLE 500 MG PO TABS: 500.0000 mg | ORAL_TABLET | Freq: Two times a day (BID) | ORAL | 0 refills | Status: DC

## 2023-08-01 MED ORDER — METOCLOPRAMIDE HCL 10 MG PO TABS: 10.0000 mg | ORAL_TABLET | Freq: Four times a day (QID) | ORAL | 0 refills | Status: DC

## 2023-08-01 MED ORDER — METRONIDAZOLE 500 MG PO TABS
500.0000 mg | ORAL_TABLET | Freq: Two times a day (BID) | ORAL | Status: DC
  Filled 2023-08-01: qty 1

## 2023-08-01 MED ORDER — ACETAMINOPHEN 325 MG PO TABS
650.0000 mg | ORAL_TABLET | Freq: Four times a day (QID) | ORAL | Status: DC
Start: 2023-08-01 — End: 2023-08-01
  Administered 2023-08-01 (×2): 650 mg via ORAL
  Filled 2023-08-01 (×2): qty 2

## 2023-08-01 MED ORDER — ONDANSETRON 4 MG PO TBDP: 4.0000 mg | ORAL_TABLET | Freq: Three times a day (TID) | ORAL | 0 refills | Status: DC | PRN

## 2023-08-01 NOTE — Discharge Summary (Signed)
 Patient ID: Maureen Cox MRN: [id number] DOB/AGE: 04/13/1995 28 y.o.  Admit date: 07/31/2023 Discharge date: 08/01/2023  Admission Diagnoses: Hyperemesis at [redacted]w[redacted]d  Discharge Diagnoses: Resolved: hyperemesis, electrolyte imbalance, and cramping  Factors complicating pregnancy: Hyperemesis History of c/s Obesity Sickle cell trait  Prenatal Procedures: ultrasound  Consults: None  Significant Diagnostic Studies:  Results for orders placed or performed during the hospital encounter of 07/31/23 (from the past week)  Lipase, blood   Collection Time: 07/30/23 11:26 PM  Result Value Ref Range   Lipase 28 11 - 51 U/L  Comprehensive metabolic panel   Collection Time: 07/30/23 11:26 PM  Result Value Ref Range   Sodium 136 135 - 145 mmol/L   Potassium 3.5 3.5 - 5.1 mmol/L   Chloride 100 98 - 111 mmol/L   CO2 22 22 - 32 mmol/L   Glucose, Bld 77 70 - 99 mg/dL   BUN 7 6 - 20 mg/dL   Creatinine, Ser 9.28 0.44 - 1.00 mg/dL   Calcium  9.5 8.9 - 10.3 mg/dL   Total Protein 7.2 6.5 - 8.1 g/dL   Albumin 3.7 3.5 - 5.0 g/dL   AST 17 15 - 41 U/L   ALT 26 0 - 44 U/L   Alkaline Phosphatase 63 38 - 126 U/L   Total Bilirubin 0.8 0.0 - 1.2 mg/dL   GFR, Estimated >39 >39 mL/min   Anion gap 14 5 - 15  CBC   Collection Time: 07/30/23 11:26 PM  Result Value Ref Range   WBC 6.7 4.0 - 10.5 K/uL   RBC 4.59 3.87 - 5.11 MIL/uL   Hemoglobin 13.1 12.0 - 15.0 g/dL   HCT 62.2 63.9 - 53.9 %   MCV 82.1 80.0 - 100.0 fL   MCH 28.5 26.0 - 34.0 pg   MCHC 34.7 30.0 - 36.0 g/dL   RDW 87.2 88.4 - 84.4 %   Platelets 419 (H) 150 - 400 K/uL   nRBC 0.0 0.0 - 0.2 %  hCG, quantitative, pregnancy   Collection Time: 07/30/23 11:26 PM  Result Value Ref Range   hCG, Beta Chain, Quant, S 228,513 (H) <5 mIU/mL  TSH   Collection Time: 07/30/23 11:26 PM  Result Value Ref Range   TSH 0.122 (L) 0.350 - 4.500 uIU/mL  Urine Drug Screen, Qualitative ([facility] only)   Collection Time: 07/31/23 12:23 AM  Result Value Ref Range    Tricyclic, Ur Screen NONE DETECTED NONE DETECTED   Amphetamines, Ur Screen NONE DETECTED NONE DETECTED   MDMA (Ecstasy)Ur Screen NONE DETECTED NONE DETECTED   Cocaine Metabolite,Ur [place] NONE DETECTED NONE DETECTED   Opiate, Ur Screen NONE DETECTED NONE DETECTED   Phencyclidine (PCP) Ur S NONE DETECTED NONE DETECTED   Cannabinoid 50 Ng, Ur [place] POSITIVE (A) NONE DETECTED   Barbiturates, Ur Screen NONE DETECTED NONE DETECTED   Benzodiazepine, Ur Scrn NONE DETECTED NONE DETECTED   Methadone Scn, Ur NONE DETECTED NONE DETECTED  Urine Culture   Collection Time: 07/31/23 12:36 AM   Specimen: Urine, Clean Catch  Result Value Ref Range   Specimen Description      URINE, CLEAN CATCH Performed at [facility], [address], [place], [place] [address]    Special Requests      NONE Performed at [facility] [address], [place], [place] [address]    Culture (A)     <10,000 COLONIES/mL INSIGNIFICANT GROWTH Performed at [facility], [address] [place], [place] [address]    Report  Status 08/01/2023 FINAL   Urinalysis, Routine w reflex microscopic -Urine, Clean Catch   Collection Time: 07/31/23 12:36 AM  Result Value Ref Range   Color, Urine YELLOW (A) YELLOW   APPearance CLOUDY (A) CLEAR   Specific Gravity, Urine 1.020 1.005 - 1.030   pH 5.0 5.0 - 8.0   Glucose, UA NEGATIVE NEGATIVE mg/dL   Hgb urine dipstick SMALL (A) NEGATIVE   Bilirubin Urine NEGATIVE NEGATIVE   Ketones, ur 80 (A) NEGATIVE mg/dL   Protein, ur 30 (A) NEGATIVE mg/dL   Nitrite NEGATIVE NEGATIVE   Leukocytes,Ua LARGE (A) NEGATIVE   RBC / HPF 6-10 0 - 5 RBC/hpf   WBC, UA >50 0 - 5 WBC/hpf   Bacteria, UA RARE (A) NONE SEEN   Squamous Epithelial / HPF 11-20 0 - 5 /HPF   WBC Clumps PRESENT    Mucus PRESENT   Chlamydia/NGC rt PCR ([facility] only)   Collection Time: 07/31/23  1:30 AM   Specimen: Cervical/Vaginal swab  Result Value Ref Range   Specimen source GC/Chlam ENDOCERVICAL     Chlamydia Tr NOT DETECTED NOT DETECTED   N gonorrhoeae NOT DETECTED NOT DETECTED  Wet prep, genital   Collection Time: 07/31/23  1:30 AM   Specimen: Cervical/Vaginal swab  Result Value Ref Range   Yeast Wet Prep HPF POC NONE SEEN NONE SEEN   Trich, Wet Prep NONE SEEN NONE SEEN   Clue Cells Wet Prep HPF POC PRESENT (A) NONE SEEN   WBC, Wet Prep HPF POC <10 <10   Sperm NONE SEEN   CBC   Collection Time: 07/31/23 11:48 AM  Result Value Ref Range   WBC 2.5 (L) 4.0 - 10.5 K/uL   MCV 85.5 80.0 - 100.0 fL   MCH 29.1 26.0 - 34.0 pg   RDW 12.8 11.5 - 15.5 %   Platelets 138 (L) 150 - 400 K/uL   nRBC 0.0 0.0 - 0.2 %  Hemoglobin and hematocrit, blood   Collection Time: 07/31/23  1:12 PM  Result Value Ref Range   Hemoglobin 10.8 (L) 12.0 - 15.0 g/dL   HCT 69.7 (L) 63.9 - 53.9 %  Comprehensive metabolic panel with GFR   Collection Time: 07/31/23  1:12 PM  Result Value Ref Range   Sodium 137 135 - 145 mmol/L   Potassium 3.3 (L) 3.5 - 5.1 mmol/L   Chloride 108 98 - 111 mmol/L   CO2 21 (L) 22 - 32 mmol/L   Glucose, Bld 88 70 - 99 mg/dL   BUN 6 6 - 20 mg/dL   Creatinine, Ser 9.35 0.44 - 1.00 mg/dL   Calcium  8.5 (L) 8.9 - 10.3 mg/dL   Total Protein 5.8 (L) 6.5 - 8.1 g/dL   Albumin 2.9 (L) 3.5 - 5.0 g/dL   AST 14 (L) 15 - 41 U/L   ALT 20 0 - 44 U/L   Alkaline Phosphatase 46 38 - 126 U/L   Total Bilirubin 0.9 0.0 - 1.2 mg/dL   GFR, Estimated >39 >39 mL/min   Anion gap 8 5 - 15  Magnesium   Collection Time: 07/31/23  1:12 PM  Result Value Ref Range   Magnesium 1.8 1.7 - 2.4 mg/dL  Basic metabolic panel with GFR   Collection Time: 08/01/23  4:58 AM  Result Value Ref Range   Sodium 136 135 - 145 mmol/L   Potassium 3.6 3.5 - 5.1 mmol/L   Chloride 107 98 - 111 mmol/L   CO2 19 (L) 22 - 32 mmol/L  Glucose, Bld 88 70 - 99 mg/dL   BUN 7 6 - 20 mg/dL   Creatinine, Ser 9.31 0.44 - 1.00 mg/dL   Calcium  8.6 (L) 8.9 - 10.3 mg/dL   GFR, Estimated >39 >39 mL/min   Anion gap 10 5 - 15     Treatments: IV hydration, antibiotics: Zosyn , azithromycin , and metronidazole , analgesia: acetaminophen  and Morphine , and antiemetics  Hospital Course:  This is a 28 y.o. G2P1001 with IUP at [redacted]w[redacted]d admitted for nausea and vomiting.  She was treated with azithromycin  for a recent positive CT result. She was started on Metronidazole  for BV. She was given various antibiotics for a presumed UTI however her urine culture came back negative and so the antibiotics were stopped. Her electrolyte imbalance was managed by pharmacy and resolved.  Her nausea and vomiting was treated with scheduled Reglan  and Pepcid  and PRN Zofran  - N/V stopped completely and she was quickly able to advance to a regular diet.  She was treated for cramping with Tylenol  which also helped. Pt requested to leave and was deemed stable for discharge to home with outpatient follow up.  Discharge Physical Exam:  BP 113/68 (BP Location: Right Arm)   Pulse 73   Temp 98.4 F (36.9 C) (Oral)   Resp 18   Ht 5' 9 (1.753 m)   Wt 100.3 kg   LMP 05/06/2023 (Exact Date)   SpO2 99%   BMI 32.65 kg/m   General: NAD CV: RRR Pulm: CTABL, nl effort ABD: s/nd/nt, gravid DVT Evaluation: LE non-ttp, no evidence of DVT on exam.      Discharge Condition: Stable  Disposition: Discharge disposition: 01-Home or Self Care        Allergies as of 08/01/2023       Reactions   Rocephin  [ceftriaxone ] Rash        Medication List     STOP taking these medications    albuterol  108 (90 Base) MCG/ACT inhaler Commonly known as: VENTOLIN  HFA   dicyclomine  10 MG capsule Commonly known as: BENTYL    Doxylamine -Pyridoxine  10-10 MG Tbec Commonly known as: Diclegis        TAKE these medications    acetaminophen  500 MG tablet Commonly known as: TYLENOL  Take 1 tablet (500 mg total) by mouth every 6 (six) hours as needed for fever, headache, mild pain (pain score 1-3) or moderate pain (pain score 4-6). What changed:  how much to  take when to take this reasons to take this   famotidine  20 MG tablet Commonly known as: PEPCID  Take 1 tablet (20 mg total) by mouth daily. Start taking on: August 02, 2023 What changed: when to take this   metoCLOPramide  10 MG tablet Commonly known as: REGLAN  Take 1 tablet (10 mg total) by mouth every 6 (six) hours for 10 days. What changed: when to take this   metroNIDAZOLE  500 MG tablet Commonly known as: FLAGYL  Take 1 tablet (500 mg total) by mouth every 12 (twelve) hours.   ondansetron  4 MG disintegrating tablet Commonly known as: ZOFRAN -ODT Take 1 tablet (4 mg total) by mouth every 8 (eight) hours as needed for nausea or vomiting.         SignedBETHA DELON COE, CNM 08/01/2023 4:50 PM

## 2023-08-01 NOTE — Progress Notes (Signed)
 ANTEPARTUM PROGRESS NOTE  Maureen Cox is a 28 y.o. G2P1001 at Unknown who is admitted for intractable nausea and vomiting in pregnancy.  Estimated Date of Delivery: None noted.  Length of Stay:  1 Days. Admitted 07/31/2023  Subjective: Patient reports resolution of nausea and vomiting, but cramping continues. Has needed Morphine  this am at 0746, previously needed once yesterday at 2010.  Otherwise cramping was treated with Tylenol .    Vitals:  BP 105/64 (BP Location: Left Arm)   Pulse 61   Temp 98.4 F (36.9 C) (Oral)   Resp 18   Ht 5' 9 (1.753 m)   Wt 100.3 kg   LMP 05/06/2023 (Exact Date)   SpO2 98%   BMI 32.65 kg/m    Intake/Output Summary (Last 24 hours) at 08/01/2023 1126 Last data filed at 08/01/2023 0900 Gross per 24 hour  Intake 4445.66 ml  Output 2375 ml  Net 2070.66 ml     Physical Examination: General:   alert and cooperative  Skin:  normal  Neurologic:    Alert & oriented x 3  Lungs:   Nl effort  Heart:   regular rate and rhythm  Abdomen:  normal findings: soft, non-tender  Extremities: : non-tender, symmetric, no edema bilaterally.       Results for orders placed or performed during the hospital encounter of 07/31/23 (from the past 48 hours)  Lipase, blood     Status: None   Collection Time: 07/30/23 11:26 PM  Result Value Ref Range   Lipase 28 11 - 51 U/L    Comment: Performed at [facility], [address] [place], [place] [address]  Comprehensive metabolic panel     Status: None   Collection Time: 07/30/23 11:26 PM  Result Value Ref Range   Sodium 136 135 - 145 mmol/L   Potassium 3.5 3.5 - 5.1 mmol/L   Chloride 100 98 - 111 mmol/L   CO2 22 22 - 32 mmol/L   Glucose, Bld 77 70 - 99 mg/dL    Comment: Glucose reference range applies only to samples taken after fasting for at least 8 hours.   BUN 7 6 - 20 mg/dL   Creatinine, Ser 9.28 0.44 - 1.00 mg/dL   Calcium  9.5 8.9 - 10.3 mg/dL   Total Protein 7.2 6.5 - 8.1 g/dL   Albumin 3.7 3.5  - 5.0 g/dL   AST 17 15 - 41 U/L   ALT 26 0 - 44 U/L   Alkaline Phosphatase 63 38 - 126 U/L   Total Bilirubin 0.8 0.0 - 1.2 mg/dL   GFR, Estimated >39 >39 mL/min    Comment: (NOTE) Calculated using the CKD-EPI Creatinine Equation (2021)    Anion gap 14 5 - 15    Comment: Performed at [facility], [address] [place], [place] [address]  CBC     Status: Abnormal   Collection Time: 07/30/23 11:26 PM  Result Value Ref Range   WBC 6.7 4.0 - 10.5 K/uL   RBC 4.59 3.87 - 5.11 MIL/uL   Hemoglobin 13.1 12.0 - 15.0 g/dL   HCT 62.2 63.9 - 53.9 %   MCV 82.1 80.0 - 100.0 fL   MCH 28.5 26.0 - 34.0 pg   MCHC 34.7 30.0 - 36.0 g/dL   RDW 87.2 88.4 - 84.4 %   Platelets 419 (H) 150 - 400 K/uL   nRBC 0.0 0.0 - 0.2 %    Comment: Performed at [facility], [address] [place],  [place] [address]  hCG, quantitative, pregnancy     Status: Abnormal   Collection Time: 07/30/23 11:26 PM  Result Value Ref Range   hCG, Beta Chain, Quant, S 228,513 (H) <5 mIU/mL    Comment:          GEST. AGE      CONC.  (mIU/mL)   <=1 WEEK        5 - 50     2 WEEKS       50 - 500     3 WEEKS       100 - 10,000     4 WEEKS     1,000 - 30,000     5 WEEKS     3,500 - 115,000   6-8 WEEKS     12,000 - 270,000    12 WEEKS     15,000 - 220,000        FEMALE AND NON-PREGNANT FEMALE:     LESS THAN 5 mIU/mL Performed at [facility], [address] [place], [place] [address]   TSH     Status: Abnormal   Collection Time: 07/30/23 11:26 PM  Result Value Ref Range   TSH 0.122 (L) 0.350 - 4.500 uIU/mL    Comment: Performed by a 3rd Generation assay with a functional sensitivity of <=0.01 uIU/mL. Performed at [facility], [address], [place], [place] [address]   Urine Drug Screen, Qualitative [facility] only)     Status: Abnormal   Collection Time: 07/31/23 12:23 AM  Result Value Ref Range   Tricyclic, Ur Screen NONE DETECTED NONE DETECTED   Amphetamines, Ur Screen NONE DETECTED  NONE DETECTED   MDMA (Ecstasy)Ur Screen NONE DETECTED NONE DETECTED   Cocaine Metabolite,Ur [place] NONE DETECTED NONE DETECTED   Opiate, Ur Screen NONE DETECTED NONE DETECTED   Phencyclidine (PCP) Ur S NONE DETECTED NONE DETECTED   Cannabinoid 50 Ng, Ur [place] POSITIVE (A) NONE DETECTED   Barbiturates, Ur Screen NONE DETECTED NONE DETECTED   Benzodiazepine, Ur Scrn NONE DETECTED NONE DETECTED   Methadone Scn, Ur NONE DETECTED NONE DETECTED    Comment: (NOTE) Tricyclics + metabolites, urine    Cutoff 1000 ng/mL Amphetamines + metabolites, urine  Cutoff 1000 ng/mL MDMA (Ecstasy), urine              Cutoff 500 ng/mL Cocaine Metabolite, urine          Cutoff 300 ng/mL Opiate + metabolites, urine        Cutoff 300 ng/mL Phencyclidine (PCP), urine         Cutoff 25 ng/mL Cannabinoid, urine                 Cutoff 50 ng/mL Barbiturates + metabolites, urine  Cutoff 200 ng/mL Benzodiazepine, urine              Cutoff 200 ng/mL Methadone, urine                   Cutoff 300 ng/mL  The urine drug screen provides only a preliminary, unconfirmed analytical test result and should not be used for non-medical purposes. Clinical consideration and professional judgment should be applied to any positive drug screen result due to possible interfering substances. A more specific alternate chemical method must be used in order to obtain a confirmed analytical result. Gas chromatography / mass spectrometry (GC/MS) is the preferred confirm atory method. Performed at [facility], [address] [place], [place] [address]   Urinalysis,  Routine w reflex microscopic -Urine, Clean Catch     Status: Abnormal   Collection Time: 07/31/23 12:36 AM  Result Value Ref Range   Color, Urine YELLOW (A) YELLOW   APPearance CLOUDY (A) CLEAR   Specific Gravity, Urine 1.020 1.005 - 1.030   pH 5.0 5.0 - 8.0   Glucose, UA NEGATIVE NEGATIVE mg/dL   Hgb urine dipstick SMALL (A) NEGATIVE   Bilirubin Urine NEGATIVE  NEGATIVE   Ketones, ur 80 (A) NEGATIVE mg/dL   Protein, ur 30 (A) NEGATIVE mg/dL   Nitrite NEGATIVE NEGATIVE   Leukocytes,Ua LARGE (A) NEGATIVE   RBC / HPF 6-10 0 - 5 RBC/hpf   WBC, UA >50 0 - 5 WBC/hpf   Bacteria, UA RARE (A) NONE SEEN   Squamous Epithelial / HPF 11-20 0 - 5 /HPF   WBC Clumps PRESENT    Mucus PRESENT     Comment: Performed at [facility], [address], [place], [place] [address]  Urine Culture     Status: Abnormal   Collection Time: 07/31/23 12:36 AM   Specimen: Urine, Clean Catch  Result Value Ref Range   Specimen Description      URINE, CLEAN CATCH Performed at [facility], [address] [place], [place] [address]    Special Requests      NONE Performed at [facility], [address] [place], [place] [address]    Culture (A)     <10,000 COLONIES/mL INSIGNIFICANT GROWTH Performed at [facility], [address], [place], [place] [address]    Report Status 08/01/2023 FINAL   Chlamydia/NGC rt PCR ([facility] only)     Status: None   Collection Time: 07/31/23  1:30 AM   Specimen: Cervical/Vaginal swab  Result Value Ref Range   Specimen source GC/Chlam ENDOCERVICAL    Chlamydia Tr NOT DETECTED NOT DETECTED   N gonorrhoeae NOT DETECTED NOT DETECTED    Comment: (NOTE) This CT/NG assay has not been evaluated in patients with a history of  hysterectomy. Performed at [facility], [address] [place], [place] [address]   Wet prep, genital     Status: Abnormal   Collection Time: 07/31/23  1:30 AM   Specimen: Cervical/Vaginal swab  Result Value Ref Range   Yeast Wet Prep HPF POC NONE SEEN NONE SEEN   Trich, Wet Prep NONE SEEN NONE SEEN   Clue Cells Wet Prep HPF POC PRESENT (A) NONE SEEN   WBC, Wet Prep HPF POC <10 <10    Comment: Specimen diluted due to transport tube containing more than 1 ml of saline, interpret results with caution.   Sperm NONE SEEN     Comment: Performed at [facility], [address] [place], [place] [address]  CBC     Status: Abnormal   Collection Time: 07/31/23 11:48 AM  Result Value Ref Range   WBC 2.5 (L) 4.0 - 10.5 K/uL   MCV 85.5 80.0 - 100.0 fL   MCH 29.1 26.0 - 34.0 pg   RDW 12.8 11.5 - 15.5 %   Platelets 138 (L) 150 - 400 K/uL   nRBC 0.0 0.0 - 0.2 %    Comment: Performed at [facility], [address] [place], [place] [address]  Hemoglobin and hematocrit, blood     Status: Abnormal   Collection Time: 07/31/23  1:12 PM  Result Value Ref Range   Hemoglobin 10.8 (L) 12.0 - 15.0 g/dL    Comment: REPEATED TO  VERIFY   HCT 30.2 (L) 36.0 - 46.0 %    Comment: Performed at [facility], [address] [place], [place] [address]  Comprehensive metabolic panel with GFR     Status: Abnormal   Collection Time: 07/31/23  1:12 PM  Result Value Ref Range   Sodium 137 135 - 145 mmol/L   Potassium 3.3 (L) 3.5 - 5.1 mmol/L   Chloride 108 98 - 111 mmol/L   CO2 21 (L) 22 - 32 mmol/L   Glucose, Bld 88 70 - 99 mg/dL    Comment: Glucose reference range applies only to samples taken after fasting for at least 8 hours.   BUN 6 6 - 20 mg/dL   Creatinine, Ser 9.35 0.44 - 1.00 mg/dL   Calcium  8.5 (L) 8.9 - 10.3 mg/dL   Total Protein 5.8 (L) 6.5 - 8.1 g/dL   Albumin 2.9 (L) 3.5 - 5.0 g/dL   AST 14 (L) 15 - 41 U/L   ALT 20 0 - 44 U/L   Alkaline Phosphatase 46 38 - 126 U/L   Total Bilirubin 0.9 0.0 - 1.2 mg/dL   GFR, Estimated >39 >39 mL/min    Comment: (NOTE) Calculated using the CKD-EPI Creatinine Equation (2021)    Anion gap 8 5 - 15    Comment: Performed at [facility], [address], [place], [place] [address]  Magnesium     Status: None   Collection Time: 07/31/23  1:12 PM  Result Value Ref Range   Magnesium 1.8 1.7 - 2.4 mg/dL    Comment: Performed at [facility], [address] [place], [place] [address]  Basic metabolic panel with GFR     Status: Abnormal   Collection Time: 08/01/23  4:58 AM  Result  Value Ref Range   Sodium 136 135 - 145 mmol/L   Potassium 3.6 3.5 - 5.1 mmol/L   Chloride 107 98 - 111 mmol/L   CO2 19 (L) 22 - 32 mmol/L   Glucose, Bld 88 70 - 99 mg/dL    Comment: Glucose reference range applies only to samples taken after fasting for at least 8 hours.   BUN 7 6 - 20 mg/dL   Creatinine, Ser 9.31 0.44 - 1.00 mg/dL   Calcium  8.6 (L) 8.9 - 10.3 mg/dL   GFR, Estimated >39 >39 mL/min    Comment: (NOTE) Calculated using the CKD-EPI Creatinine Equation (2021)    Anion gap 10 5 - 15    Comment: Performed at [facility], [address] [place], [place] [address]    US  OB LESS THAN 14 WEEKS W/ OB TRANSVAGINAL AND DOPPLER Result Date: 07/31/2023 CLINICAL DATA:  Known positive pregnancy with pelvic pain, initial encounter EXAM: OBSTETRIC <14 WK US  AND TRANSVAGINAL OB US  DOPPLER ULTRASOUND OF OVARIES TECHNIQUE: Both transabdominal and transvaginal ultrasound examinations were performed for complete evaluation of the gestation as well as the maternal uterus, adnexal regions, and pelvic cul-de-sac. Transvaginal technique was performed to assess early pregnancy. Color and duplex Doppler ultrasound was utilized to evaluate blood flow to the ovaries. COMPARISON:  None Available. FINDINGS: Intrauterine gestational sac: Present Yolk sac:  Present Embryo:  Present Cardiac Activity: Present Heart Rate: 169 bpm CRL:   20.2 mm   8 w 4 d                  US  EDC: 03/07/2024 Subchorionic hemorrhage:  None visualized. Maternal uterus/adnexae: Ovaries are within normal limits. A mildly complex cyst is noted  adjacent to the vaginal canal measuring 4.4 x 2.0 x 5.3 cm. This was present on a prior exam from 2021. 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 Pulsed Doppler evaluation of both ovaries demonstrates normal appearing low-resistance arterial and venous waveforms. IMPRESSION: Single live intrauterine gestation at 8 weeks 4 days. 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 This was noted  on prior examination in 2021. Electronically Signed   By: Oneil Devonshire M.D.   On: 07/31/2023 00:48    Current scheduled medications  acetaminophen   650 mg Oral Q6H   famotidine   20 mg Oral Daily   metoCLOPramide   10 mg Oral Q6H   Or   metoCLOPramide  (REGLAN ) injection  10 mg Intravenous Q6H    I have reviewed the patient's current medications.  ASSESSMENT: Patient Active Problem List   Diagnosis Date Noted   Hyperemesis gravidarum 07/31/2023   Bacterial vaginosis in pregnancy 07/31/2023   UTI (urinary tract infection) during pregnancy 07/31/2023   Previous cesarean delivery affecting pregnancy 07/31/2023   Hyperemesis gravidarum to [redacted] weeks gestation, electrolyte imbalance 07/31/2023   Tobacco use 09/16/2019   Vaginal mass 09/13/2019   Sickle cell trait (HCC) 04/18/2019   Morbid obesity (HCC) 210 lbs 11/12/2018   PTSD (post-traumatic stress disorder) 2020 11/12/2018   History of marijuana use 11/12/2018    PLAN: Routine antenatal care  - Activity as tolerated  - Bathroom privileges  - OB US  completed - viable IUP seen c/w dates    Hyperemesis gravidarum  - hyperemesis protocol initiated  - Advance diet to regular diet today  - Maintain IV fluids and daily banana bag - Scheduled PO antiemetics with PRN IV/PO/PR antiemetics for breakthrough N/V  - Daily weight  - Q4 hour intake/output    UTI pregnancy  - UA concerning for UTI  - Urine culture is neg  - Per pharmacy can stop antibiotics   Bacterial vaginosis in pregnancy  - Wet prep positive for BV  - IV flagyl  ordered  - Transitioned to PO flagyl      Recent chlamydia infection  - Positive for chlamydia on 07/18/2023  - Rx for azithromycin  was sent to pharmacy for treatment but pt reports she vomited the medications up 30 minutes after taking them  - GC/CT screen negative today  - Azith 500 mg x 1 dose ordered for prophylaxis   Electrolyte Imbalance - Managed by pharmacy    History of marijuana use  - Reports  last use was 5 weeks ago  - UDS positive for THC    Maureen Cox, CNM 08/01/2023 11:18 AM  Maureen Cox Certified Nurse Midwife [facility]

## 2023-08-01 NOTE — Progress Notes (Signed)
 PHARMACY CONSULT NOTE - FOLLOW UP  Pharmacy Consult for Electrolyte Monitoring and Replacement   Recent Labs: Potassium (mmol/L)  Date Value  07/31/2023 3.3 (L)   Magnesium (mg/dL)  Date Value  92/85/7974 1.8   Calcium  (mg/dL)  Date Value  92/85/7974 8.5 (L)   Albumin (g/dL)  Date Value  92/85/7974 2.9 (L)   Sodium (mmol/L)  Date Value  07/31/2023 137     Assessment: 7/14 @ 1312: K = 3.3                   Ca = 8.5,  albumin = 2.9,  Corrected Ca = 9.38  Goal of Therapy:  Electrolytes WNL   Plan:  - will order KCl 10 mEq IV X 2 and recheck electrolytes on 7/15 with AM labs.   Silver Selinda BIRCH ,PharmD Clinical Pharmacist 08/01/2023 1:59 AM

## 2023-08-01 NOTE — Progress Notes (Signed)
 Patient discharged. Discharge instructions given. Patient verbalizes understanding. Transported by staff.

## 2023-08-24 DIAGNOSIS — O99320 Drug use complicating pregnancy, unspecified trimester: Secondary | ICD-10-CM | POA: Insufficient documentation

## 2023-08-24 DIAGNOSIS — Z3491 Encounter for supervision of normal pregnancy, unspecified, first trimester: Secondary | ICD-10-CM | POA: Insufficient documentation

## 2023-09-11 ENCOUNTER — Inpatient Hospital Stay

## 2023-09-11 ENCOUNTER — Inpatient Hospital Stay: Attending: Oncology | Admitting: Oncology

## 2023-09-11 ENCOUNTER — Encounter: Payer: Self-pay | Admitting: Oncology

## 2023-09-11 VITALS — BP 111/63 | HR 105 | Temp 97.6°F | Resp 20 | Wt 224.7 lb

## 2023-09-11 DIAGNOSIS — F17210 Nicotine dependence, cigarettes, uncomplicated: Secondary | ICD-10-CM | POA: Diagnosis not present

## 2023-09-11 DIAGNOSIS — Z331 Pregnant state, incidental: Secondary | ICD-10-CM | POA: Insufficient documentation

## 2023-09-11 DIAGNOSIS — D573 Sickle-cell trait: Secondary | ICD-10-CM

## 2023-09-11 LAB — CBC WITH DIFFERENTIAL/PLATELET
Abs Immature Granulocytes: 0.03 K/uL (ref 0.00–0.07)
Basophils Absolute: 0.1 K/uL (ref 0.0–0.1)
Basophils Relative: 1 %
Eosinophils Absolute: 0.2 K/uL (ref 0.0–0.5)
Eosinophils Relative: 2 %
HCT: 33.7 % — ABNORMAL LOW (ref 36.0–46.0)
Hemoglobin: 11.5 g/dL — ABNORMAL LOW (ref 12.0–15.0)
Immature Granulocytes: 0 %
Lymphocytes Relative: 21 %
Lymphs Abs: 1.7 K/uL (ref 0.7–4.0)
MCH: 29.2 pg (ref 26.0–34.0)
MCHC: 34.1 g/dL (ref 30.0–36.0)
MCV: 85.5 fL (ref 80.0–100.0)
Monocytes Absolute: 0.4 K/uL (ref 0.1–1.0)
Monocytes Relative: 5 %
Neutro Abs: 5.7 K/uL (ref 1.7–7.7)
Neutrophils Relative %: 71 %
Platelets: 422 K/uL — ABNORMAL HIGH (ref 150–400)
RBC: 3.94 MIL/uL (ref 3.87–5.11)
RDW: 13.4 % (ref 11.5–15.5)
WBC: 8.1 K/uL (ref 4.0–10.5)
nRBC: 0 % (ref 0.0–0.2)

## 2023-09-11 LAB — FERRITIN: Ferritin: 24 ng/mL (ref 11–307)

## 2023-09-11 LAB — IRON AND TIBC
Iron: 64 ug/dL (ref 28–170)
Saturation Ratios: 19 % (ref 10.4–31.8)
TIBC: 337 ug/dL (ref 250–450)
UIBC: 273 ug/dL

## 2023-09-11 LAB — VITAMIN B12: Vitamin B-12: 163 pg/mL — ABNORMAL LOW (ref 180–914)

## 2023-09-11 NOTE — Progress Notes (Signed)
 Patient has no concerns

## 2023-09-11 NOTE — Progress Notes (Signed)
 Hematology/Oncology Consult note [facility] [phone] [phone]  Patient Care Team: [facility] as PCP - General   Name of the patient: Maureen Cox  [id number]  August 27, 1995    Reason for referral- h/o sickle cell trait in pregnancy   Referring physician-Katherine Orlando, FNP  Date of visit: 09/11/23   History of presenting illness-patient is a 28 year old African-American female currently at 15 weeks of gestation in her second pregnancy.  She delivered her first child 2 years ago through C-section but otherwise it was an uneventful delivery.  Presently her pregnancy is coming along well.  She was diagnosed having sickle cell trait.  Labs from 08/24/2023 showed hemoglobin A of 57, A2 of 3.4 and hemoglobin S of 39.6%.  States that she is aware that she has sickle cell trait and did not have any complications in her first pregnancy  ECOG PS- 0  Pain scale- 0   Review of systems- Review of Systems  Constitutional:  Negative for chills, fever, malaise/fatigue and weight loss.  HENT:  Negative for congestion, ear discharge and nosebleeds.   Eyes:  Negative for blurred vision.  Respiratory:  Negative for cough, hemoptysis, sputum production, shortness of breath and wheezing.   Cardiovascular:  Negative for chest pain, palpitations, orthopnea and claudication.  Gastrointestinal:  Negative for abdominal pain, blood in stool, constipation, diarrhea, heartburn, melena, nausea and vomiting.  Genitourinary:  Negative for dysuria, flank pain, frequency, hematuria and urgency.  Musculoskeletal:  Negative for back pain, joint pain and myalgias.  Skin:  Negative for rash.  Neurological:  Negative for dizziness, tingling, focal weakness, seizures, weakness and headaches.  Endo/Heme/Allergies:  Does not bruise/bleed easily.  Psychiatric/Behavioral:  Negative for depression and suicidal ideas. The patient does not have insomnia.      Allergies  Allergen Reactions   Rocephin  [Ceftriaxone ] Rash    Patient Active Problem List   Diagnosis Date Noted   Encounter for supervision of normal pregnancy in first trimester 08/24/2023   Marijuana use during pregnancy 08/24/2023   Hyperemesis gravidarum 07/31/2023   Bacterial vaginosis in pregnancy 07/31/2023   UTI (urinary tract infection) during pregnancy 07/31/2023   Previous cesarean delivery affecting pregnancy 07/31/2023   Hyperemesis gravidarum to [redacted] weeks gestation, electrolyte imbalance 07/31/2023   Tobacco use 09/16/2019   Vaginal mass 09/13/2019   Major depressive disorder, single episode, moderate (HCC) 05/16/2019   Sickle cell trait (HCC) 04/18/2019   Morbid obesity (HCC) 210 lbs 11/12/2018   PTSD (post-traumatic stress disorder) 2020 11/12/2018   History of marijuana use 11/12/2018     Past Medical History:  Diagnosis Date   Depression    PTSD (post-traumatic stress disorder)      Past Surgical History:  Procedure Laterality Date   CESAREAN SECTION  10/08/2019   Procedure: CESAREAN SECTION;  Surgeon: Schermerhorn, Debby PARAS, MD;  Location: [facility] ORS;  Service: Obstetrics;;    Social History   Socioeconomic History   Marital status: Single    Spouse name: Not on file   Number of children: Not on file   Years of education: Not on file   Highest education level: Not on file  Occupational History   Not on file  Tobacco Use   Smoking status: Every Day    Current packs/day: 0.50    Types: Cigarettes   Smokeless tobacco: Never   Tobacco comments:    2 cigarettes a day  Vaping Use   Vaping status: Never Used  Substance and Sexual Activity  Alcohol use: Not Currently    Alcohol/week: 12.0 standard drinks of alcohol    Types: 12 Shots of liquor per week    Comment: qo weekend   Drug use: Not Currently    Types: Marijuana    Comment: Last use marijuna last week.  Last use of opiods was in June 2021   Sexual activity: Not Currently     Birth control/protection: Injection    Comment: depo  Other Topics Concern   Not on file  Social History Narrative   Not on file   Social Drivers of Health   Financial Resource Strain: Low Risk  (08/24/2023)   Received from [facility]   Overall Financial Resource Strain (CARDIA)    Difficulty of Paying Living Expenses: Not very hard  Food Insecurity: No Food Insecurity (09/11/2023)   Hunger Vital Sign    Worried About Running Out of Food in the Last Year: Never true    Ran Out of Food in the Last Year: Never true  Transportation Needs: No Transportation Needs (09/11/2023)   PRAPARE - Administrator, Civil Service (Medical): No    Lack of Transportation (Non-Medical): No  Recent Concern: Transportation Needs - Unmet Transportation Needs (08/24/2023)   Received from [facility] - Transportation    In the past 12 months, has lack of transportation kept you from medical appointments or from getting medications?: Yes    Lack of Transportation (Non-Medical): Yes  Physical Activity: Not on file  Stress: Not on file  Social Connections: Not on file  Intimate Partner Violence: Not At Risk (09/11/2023)   Humiliation, Afraid, Rape, and Kick questionnaire    Fear of Current or Ex-Partner: No    Emotionally Abused: No    Physically Abused: No    Sexually Abused: No     History reviewed. No pertinent family history.   Current Outpatient Medications:    acetaminophen  (TYLENOL ) 500 MG tablet, Take 1 tablet (500 mg total) by mouth every 6 (six) hours as needed for fever, headache, mild pain (pain score 1-3) or moderate pain (pain score 4-6)., Disp: , Rfl:    famotidine  (PEPCID ) 20 MG tablet, Take 1 tablet (20 mg total) by mouth daily. (Patient not taking: Reported on 09/11/2023), Disp: 30 tablet, Rfl: 11   metoCLOPramide  (REGLAN ) 10 MG tablet, Take 1 tablet (10 mg total) by mouth every 6 (six) hours for 10 days. (Patient not taking: Reported  on 09/11/2023), Disp: 40 tablet, Rfl: 0   metroNIDAZOLE  (FLAGYL ) 500 MG tablet, Take 1 tablet (500 mg total) by mouth every 12 (twelve) hours. (Patient not taking: Reported on 09/11/2023), Disp: 11 tablet, Rfl: 0   ondansetron  (ZOFRAN -ODT) 4 MG disintegrating tablet, Take 1 tablet (4 mg total) by mouth every 8 (eight) hours as needed for nausea or vomiting. (Patient not taking: Reported on 09/11/2023), Disp: 20 tablet, Rfl: 0   Physical exam:  Vitals:   09/11/23 1422  BP: 111/63  Pulse: (!) 105  Resp: 20  Temp: 97.6 F (36.4 C)  SpO2: 100%  Weight: 224 lb 11.2 oz (101.9 kg)   Physical Exam Cardiovascular:     Rate and Rhythm: Normal rate and regular rhythm.     Heart sounds: Normal heart sounds.  Pulmonary:     Effort: Pulmonary effort is normal.     Breath sounds: Normal breath sounds.  Skin:    General: Skin is warm and dry.  Neurological:     Mental  Status: She is alert and oriented to person, place, and time.           Latest Ref Rng & Units 08/01/2023    4:58 AM  CMP  Glucose 70 - 99 mg/dL 88   BUN 6 - 20 mg/dL 7   Creatinine 9.55 - 8.99 mg/dL 9.31   Sodium 864 - 854 mmol/L 136   Potassium 3.5 - 5.1 mmol/L 3.6   Chloride 98 - 111 mmol/L 107   CO2 22 - 32 mmol/L 19   Calcium  8.9 - 10.3 mg/dL 8.6       Latest Ref Rng & Units 09/11/2023    3:25 PM  CBC  WBC 4.0 - 10.5 K/uL 8.1   Hemoglobin 12.0 - 15.0 g/dL 88.4   Hematocrit 63.9 - 46.0 % 33.7   Platelets 150 - 400 K/uL 422     Assessment and plan- Patient is a 28 y.o. female with history of sickle cell trait presently in second trimester of pregnancy referred for further management  Sickle cell trait during pregnancy can be associated with increased risk of certain adverse maternal and fetal outcomes such as preeclampsia and asymptomatic bacteriuria or your UTI.  There is also increased risk of stillbirth and vaso-occlusive crisis.  No specific interventions or dose adjustments are recommended specifically based  on sickle cell trait status but in general hematology oncology here at [facility] do not manage sickle cell patients and therefore I would like to refer this patient to [facility] sickle cell center for any further recommendations or monitoring during pregnancy.  I am checking her CBC ferritin and iron Studies today.  I will see her back in 2 weeks to discuss the results of blood work   Thank you for this kind referral and the opportunity to participate in the care of this  Patient   Visit Diagnosis 1. Sickle cell trait (HCC)     Dr. Annah Skene, MD, MPH [facility] at [facility] [phone] 09/11/2023

## 2023-09-26 ENCOUNTER — Encounter: Payer: Self-pay | Admitting: Oncology

## 2023-09-26 ENCOUNTER — Inpatient Hospital Stay: Attending: Oncology | Admitting: Oncology

## 2023-10-11 ENCOUNTER — Ambulatory Visit: Attending: Obstetrics and Gynecology

## 2023-11-21 ENCOUNTER — Telehealth: Payer: Self-pay | Admitting: Licensed Clinical Social Worker

## 2023-11-21 NOTE — Telephone Encounter (Signed)
-----   Message from Leopold GORMAN Irving sent at 11/21/2023  4:56 PM EST ----- Regarding: Mental Health Services Hi Ms. Ellender Peters the Pregnancy Care Manager working with Ms. Icard and she recently reported a changes in mood during pregnancy due to her hormones and some feelings of being down lately. She has a history of depression and has experienced post partum in a previous pregnancy. She has been prescribed medication but refuses to take it due to it causing her to become sleepy. Today, she expressed being interested in mental health services to address her needs. I reviewed her chart and saw on 10/15 that her provider had also noted mood changes and feelings of depression with an attached note that said a referral would be made to you for counseling. I wasn't sure if her provider had made the referral but in case she did not, I wanted to make a referral for Ms. Rosalynn as she requested.   Thanks,  Leopold Irving, MSW [facility] Care Manager  [facility]

## 2023-11-22 ENCOUNTER — Telehealth: Payer: Self-pay | Admitting: Licensed Clinical Social Worker

## 2023-11-22 NOTE — Telephone Encounter (Signed)
-----   Message from Leopold GORMAN Irving sent at 11/22/2023  9:07 AM EST ----- Regarding: RE: Mental Health Services Good morning Alan!  No that number is incorrect, I believe its out of service actually. My apologies, I forgot to attach her new phone number. I had been trying to reach her at that number as well and then was informed yesterday she got a new number which is in service where I was able to reach her. Her updated telephone number is [phone].   Thanks, KH ----- Message ----- From: Ellender Alan, KEN Sent: 11/21/2023   5:09 PM EST To: Leopold GORMAN Irving, LCSW Subject: RE: Mental Health Services                     Hello Leopold, Do you know if the number is correct in this system? I tried calling 3x and it appeared to connect but there was no response, not sure what happened. Thank you, Alan ----- Message ----- From: Irving Leopold GORMAN KEN Sent: 11/21/2023   4:58 PM EST To: Alan Ellender, LCSW Subject: Mental Health Services                         Hi Ms. Ellender Peters the Pregnancy Care Manager working with Ms. Gitto and she recently reported a changes in mood during pregnancy due to her hormones and some feelings of being down lately. She has a history of depression and has experienced post partum in a previous pregnancy. She has been prescribed medication but refuses to take it due to it causing her to become sleepy. Today, she expressed being interested in mental health services to address her needs. I reviewed her chart and saw on 10/15 that her provider had also noted mood changes and feelings of depression with an attached note that said a referral would be made to you for counseling. I wasn't sure if her provider had made the referral but in case she did not, I wanted to make a referral for Ms. Rosalynn as she requested.   Thanks,  Leopold Irving, MSW [facility] Care Manager  [facility]

## 2023-11-29 ENCOUNTER — Inpatient Hospital Stay
Admission: EM | Admit: 2023-11-29 | Discharge: 2023-11-29 | Disposition: A | Discharge status: Home / Self Care | Attending: Certified Nurse Midwife | Admitting: Certified Nurse Midwife

## 2023-11-29 ENCOUNTER — Other Ambulatory Visit: Payer: Self-pay

## 2023-11-29 DIAGNOSIS — O99212 Obesity complicating pregnancy, second trimester: Secondary | ICD-10-CM | POA: Insufficient documentation

## 2023-11-29 DIAGNOSIS — O212 Late vomiting of pregnancy: Secondary | ICD-10-CM | POA: Diagnosis present

## 2023-11-29 DIAGNOSIS — D573 Sickle-cell trait: Secondary | ICD-10-CM | POA: Insufficient documentation

## 2023-11-29 DIAGNOSIS — O2342 Unspecified infection of urinary tract in pregnancy, second trimester: Secondary | ICD-10-CM

## 2023-11-29 DIAGNOSIS — O34219 Maternal care for unspecified type scar from previous cesarean delivery: Secondary | ICD-10-CM | POA: Diagnosis not present

## 2023-11-29 DIAGNOSIS — O210 Mild hyperemesis gravidarum: Secondary | ICD-10-CM

## 2023-11-29 DIAGNOSIS — F1291 Cannabis use, unspecified, in remission: Secondary | ICD-10-CM

## 2023-11-29 DIAGNOSIS — B9689 Other specified bacterial agents as the cause of diseases classified elsewhere: Secondary | ICD-10-CM

## 2023-11-29 DIAGNOSIS — Z3A25 25 weeks gestation of pregnancy: Secondary | ICD-10-CM | POA: Diagnosis not present

## 2023-11-29 DIAGNOSIS — O99012 Anemia complicating pregnancy, second trimester: Secondary | ICD-10-CM | POA: Diagnosis not present

## 2023-11-29 LAB — RESP PANEL BY RT-PCR (RSV, FLU A&B, COVID)  RVPGX2
Influenza A by PCR: NEGATIVE
Influenza B by PCR: NEGATIVE
Resp Syncytial Virus by PCR: NEGATIVE
SARS Coronavirus 2 by RT PCR: NEGATIVE

## 2023-11-29 MED ORDER — LACTATED RINGERS IV BOLUS
1000.0000 mL | Freq: Once | INTRAVENOUS | Status: AC
  Administered 2023-11-29: 1000 mL via INTRAVENOUS

## 2023-11-29 MED ORDER — ONDANSETRON HCL 4 MG/2ML IJ SOLN
4.0000 mg | Freq: Once | INTRAMUSCULAR | Status: AC
  Administered 2023-11-29: 4 mg via INTRAVENOUS

## 2023-11-29 MED ORDER — ONDANSETRON 4 MG PO TBDP: 4.0000 mg | ORAL_TABLET | Freq: Three times a day (TID) | ORAL | 0 refills | Status: AC | PRN

## 2023-11-29 MED ORDER — ONDANSETRON HCL 4 MG/2ML IJ SOLN
INTRAMUSCULAR | Status: AC
  Filled 2023-11-29: qty 2

## 2023-11-29 NOTE — Discharge Summary (Signed)
 Patient ID: Maureen Cox MRN: [id number] DOB/AGE: 1995/08/31 28 y.o.  Admit date: 11/29/2023 Discharge date: 11/29/2023  Admission Diagnoses: 28yo G2P1 at [redacted]w[redacted]d present with N/V/D that started today.  She reports no one else around her has similar symptoms.  Discharge Diagnoses: Likely viral N/V/D  Factors complicating pregnancy: Hyperemesis History of c/s Obesity Sickle cell trait  Prenatal Procedures: none  Consults: None  Significant Diagnostic Studies:  Results for orders placed or performed during the hospital encounter of 11/29/23 (from the past week)  Resp panel by RT-PCR (RSV, Flu A&B, Covid) Anterior Nasal Swab   Collection Time: 11/29/23  6:36 PM   Specimen: Anterior Nasal Swab  Result Value Ref Range   SARS Coronavirus 2 by RT PCR NEGATIVE NEGATIVE   Influenza A by PCR NEGATIVE NEGATIVE   Influenza B by PCR NEGATIVE NEGATIVE   Resp Syncytial Virus by PCR NEGATIVE NEGATIVE    Treatments: IV hydration and Zofran   Hospital Course:  This is a 28 y.o. G2P1001 with IUP at [redacted]w[redacted]d seen for N/V/D.  Zofran  was given with some relief.  She was observed, fetal heart rate monitoring remained reassuring, and she had no signs/symptoms of preterm labor or other maternal-fetal concerns. She was deemed stable for discharge to home with outpatient follow up.  Discharge Physical Exam:  BP 138/68 (BP Location: Right Arm)   Pulse 100   Temp 98.9 F (37.2 C) (Oral)   Resp 18   Ht 5' 9 (1.753 m)   Wt 108 kg   LMP 05/06/2023 (Exact Date)   BMI 35.15 kg/m   FHT: Appropriate for GA  TOCO: quiet SVE: deferred      Discharge Condition: Stable  Disposition:  Discharge disposition: 01-Home or Self Care        Allergies as of 11/29/2023       Reactions   Rocephin  [ceftriaxone ] Rash        Medication List     STOP taking these medications    famotidine  20 MG tablet Commonly known as: PEPCID    metoCLOPramide  10 MG tablet Commonly known as: REGLAN     metroNIDAZOLE  500 MG tablet Commonly known as: FLAGYL        TAKE these medications    acetaminophen  500 MG tablet Commonly known as: TYLENOL  Take 1 tablet (500 mg total) by mouth every 6 (six) hours as needed for fever, headache, mild pain (pain score 1-3) or moderate pain (pain score 4-6).   ondansetron  4 MG disintegrating tablet Commonly known as: ZOFRAN -ODT Take 1 tablet (4 mg total) by mouth every 8 (eight) hours as needed for nausea or vomiting. What changed: You were already taking a medication with the same name, and this prescription was added. Make sure you understand how and when to take each.   ondansetron  4 MG disintegrating tablet Commonly known as: ZOFRAN -ODT Take 1 tablet (4 mg total) by mouth every 8 (eight) hours as needed for nausea or vomiting. What changed: Another medication with the same name was added. Make sure you understand how and when to take each.   prenatal multivitamin Tabs tablet Take 1 tablet by mouth daily at 12 noon.         SignedBETHA DELON COE, CNM 11/29/2023 8:12 PM

## 2023-11-29 NOTE — OB Triage Note (Addendum)
 Pt reports to labor and delivery with complaints of N/V/D that started this morning. Yesterday, she ate a hot dog, french fries, cereal and ice cream for dinner. Today, she has attempted to drink ginger ale and has vomited everything she ingested today.   Denies vaginal bleeding, LOF, and contractions. States positive fetal movement.   5x diarrhea today 5x emesis today - large volume  EFM and toco applied and assessing.

## 2023-12-05 ENCOUNTER — Ambulatory Visit

## 2023-12-06 ENCOUNTER — Ambulatory Visit: Admitting: Licensed Clinical Social Worker

## 2023-12-06 NOTE — Progress Notes (Unsigned)
 Counselor Initial Adult Exam  Name: Maureen Cox Date: 12/06/2023 MRN: [id number] DOB: 1995-09-12 PCP: [facility]  ## total minutes. I spent ## minutes face to face with the patient on the date of service. I spent an additional ##  minutes on pre- and post-visit activities on the date of service including collateral, chart review, team discussion, and documentation.   A biopsychosocial was completed on the Patient. Background information and current concerns were obtained during an intake in the office with the [facility] Department clinician, Alan Hail, LCSW.  Reviewed professional disclosure, contact information and confidentiality was discussed and appropriate consents were signed.     Reason for Visit /Presenting Problem: Patient presents with history of PTSD, depression, tobacco abuse, and substance abuse disorder. Today, patient presents   Mental Status Exam:    Appearance:   {PSY:22683}     Behavior:  {PSY:21022743}  Motor:  {PSY:22302}  Speech/Language:   {PSY:22685}  Affect:  {PSY:22687}  Mood:  {PSY:31886}  Thought process:  {PSY:31888}  Thought content:    {PSY:564-554-2605}  Sensory/Perceptual disturbances:    {PSY:386-236-0791}  Orientation:  {PSY:30297}  Attention:  {PSY:22877}  Concentration:  {PSY:912-308-2671}  Memory:  {[phone]}  Fund of knowledge:   {PSY:912-308-2671}  Insight:    {PSY:912-308-2671}  Judgment:   {PSY:912-308-2671}  Impulse Control:  {PSY:912-308-2671}   Reported Symptoms:  {PSY:901-341-5429}  Risk Assessment: Danger to Self:  {PSY:22692} Self-injurious Behavior: {PSY:22692} Danger to Others: {PSY:22692} Duty to Warn:{PSY:311194} Physical Aggression / Violence:{PSY:21197} Access to Firearms a concern: {PSY:21197} Gang Involvement:{PSY:21197} Patient / guardian was educated about steps to take if suicide or homicide risk level increases between visits: yes While future psychiatric events cannot be accurately predicted,  the patient does not currently require acute inpatient psychiatric care and does not currently meet [place]  involuntary commitment criteria.  Substance Abuse History: Current substance abuse: {PSY:21197}    Past Psychiatric History:   {Past psych history:20559} Outpatient Providers:*** History of Psych Hospitalization: {PSY:21197} Psychological Testing: {PSY:21014032}   Abuse History: Victim of {Abuse History:314532}, {Type of abuse:20566}   Report needed: {PSY:314532} Victim of Neglect:{yes no:314532} Perpetrator of {PSY:20566}  Witness / Exposure to Domestic Violence: {PSY:21197}  Protective Services Involvement: {PSY:21197} Witness to [employer] Violence:  {PSY:21197}  Family History: No family history on file.  Social History:  Social History   Socioeconomic History   Marital status: Single    Spouse name: Not on file   Number of children: Not on file   Years of education: Not on file   Highest education level: Not on file  Occupational History   Not on file  Tobacco Use   Smoking status: Every Day    Current packs/day: 0.50    Types: Cigarettes   Smokeless tobacco: Never   Tobacco comments:    2 cigarettes a day  Vaping Use   Vaping status: Never Used  Substance and Sexual Activity   Alcohol use: Not Currently    Alcohol/week: 12.0 standard drinks of alcohol    Types: 12 Shots of liquor per week    Comment: qo weekend   Drug use: Not Currently    Types: Marijuana    Comment: Last use marijuna last week.  Last use of opiods was in June 2021   Sexual activity: Not Currently    Birth control/protection: Injection    Comment: depo  Other Topics Concern   Not on file  Social History Narrative   Not on file   Social Drivers of Health  Financial Resource Strain: Low Risk  (08/24/2023)   [facility] from [facility]   Overall Financial Resource Strain (CARDIA)    Difficulty of Paying Living Expenses: Not very hard  Food Insecurity: No  Food Insecurity (09/11/2023)   Hunger Vital Sign    Worried About Running Out of Food in the Last Year: Never true    Ran Out of Food in the Last Year: Never true  Transportation Needs: No Transportation Needs (09/11/2023)   PRAPARE - Administrator, Civil Service (Medical): No    Lack of Transportation (Non-Medical): No  Recent Concern: Transportation Needs - Unmet Transportation Needs (08/24/2023)   Received from [facility] - Transportation    In the past 12 months, has lack of transportation kept you from medical appointments or from getting medications?: Yes    Lack of Transportation (Non-Medical): Yes  Physical Activity: Not on file  Stress: Not on file  Social Connections: Not on file    Living situation: the patient {lives:315711::lives with their family}  Sexual Orientation:  {Sexual Orientation:704-194-3745}  Relationship Status: {Desc; marital status:62}  Name of spouse / other:***             If a parent, number of children / ages:***  Support Systems; {DIABETES SUPPORT:20310}  Financial Stress:  {YES/NO:21197}  Income/Employment/Disability: Manufacturing Engineer: [employer]  Educational History: Education: {PSY :31912}  Religion/Sprituality/World View:   {CHL AMB RELIGION/SPIRITUALITY:([phone]}  Any cultural differences that may affect / interfere with treatment:  {Religious/Cultural:200019}  Recreation/Hobbies: {Woc hobbies:30428}  Stressors:{PATIENT STRESSORS:22669}  Strengths:  {Patient Coping Strengths:6075267929}  Barriers:  ***   Legal History: Pending legal issue / charges: {PSY:20588} History of legal issue / charges: {Legal [phone]}  Medical History/Surgical History:reviewed Past Medical History:  Diagnosis Date   Depression    PTSD (post-traumatic stress disorder)     Past Surgical History:  Procedure Laterality Date   CESAREAN SECTION  10/08/2019    Procedure: CESAREAN SECTION;  Surgeon: Schermerhorn, Debby PARAS, MD;  Location: [facility] ORS;  Service: Obstetrics;;    Medications: Current Outpatient Medications  Medication Sig Dispense Refill   acetaminophen  (TYLENOL ) 500 MG tablet Take 1 tablet (500 mg total) by mouth every 6 (six) hours as needed for fever, headache, mild pain (pain score 1-3) or moderate pain (pain score 4-6).     ondansetron  (ZOFRAN -ODT) 4 MG disintegrating tablet Take 1 tablet (4 mg total) by mouth every 8 (eight) hours as needed for nausea or vomiting. 20 tablet 0   ondansetron  (ZOFRAN -ODT) 4 MG disintegrating tablet Take 1 tablet (4 mg total) by mouth every 8 (eight) hours as needed for nausea or vomiting. 20 tablet 0   Prenatal Vit-Fe Fumarate-FA (PRENATAL MULTIVITAMIN) TABS tablet Take 1 tablet by mouth daily at 12 noon.     No current facility-administered medications for this visit.    Allergies  Allergen Reactions   Rocephin  [Ceftriaxone ] Rash   Vallerie S Ruz is a 28 y.o. year old female with a reported history of mental health diagnoses of Posttraumatic Stress Disorder and Depression. Patient currently presents with **** that she reports she has experienced for a *** time. Patient currently describes both depressive symptoms and anxiety symptoms. She reports significant *** symptoms, including ***. Although patient endorses these vague suicidal ideations, she denies any current plan, intent, or means to harm herself. She also describes ***. Patient reports that these symptoms significantly impact her functioning in multiple life domains.  Due to the above symptoms and patient's reported history, patient is diagnosed with Major Depressive Disorder, recurrent episode, Moderate and Generalized Anxiety Disorder, With panic attacks. Patient's mood symptoms should continue to be monitored closely to provide further diagnosis clarification. Continued mental health treatment is needed to address patient's symptoms and monitor  her safety and stability. Patient is recommended for psychiatric medication management evaluation and continued outpatient therapy to further reduce her symptoms and improve her coping strategies.    There is no acute risk for suicide or violence at this time.  While future psychiatric events cannot be accurately predicted, the patient does not require acute inpatient psychiatric care and does not currently meet [place]  involuntary commitment criteria.    Diagnoses:  No diagnosis found.  Plan of Care: Patient's goal of treatment and treatment plan will be developed at follow up visit.    Future Appointments  Date Time Provider Department Center  12/06/2023  2:00 PM Ellender Palma, LCSW [facility] None    [place], [place]

## 2023-12-25 ENCOUNTER — Ambulatory Visit: Attending: Obstetrics and Gynecology

## 2024-02-26 ENCOUNTER — Inpatient Hospital Stay: Admission: RE | Admit: 2024-02-26

## 2024-03-01 ENCOUNTER — Encounter: Admission: RE | Payer: Self-pay

## 2024-03-01 ENCOUNTER — Inpatient Hospital Stay: Admission: RE | Admit: 2024-03-01 | Admitting: Obstetrics and Gynecology
